# Patient Record
Sex: Female | Born: 1975 | Race: White | Hispanic: No | Marital: Married | Smoking: Never smoker
Health system: Southern US, Community
[De-identification: ages and names within clinical notes are randomized; demographics above are authoritative.]

## PROBLEM LIST (undated history)

## (undated) DIAGNOSIS — Z86018 Personal history of other benign neoplasm: Secondary | ICD-10-CM

## (undated) HISTORY — DX: Personal history of other benign neoplasm: Z86.018

---

## 2000-05-20 HISTORY — PX: ARTHROSCOPIC REPAIR ACL: SUR80

## 2001-05-20 HISTORY — PX: OTHER SURGICAL HISTORY: SHX169

## 2006-07-07 ENCOUNTER — Inpatient Hospital Stay: Payer: Self-pay | Admitting: Obstetrics and Gynecology

## 2006-08-19 LAB — CONVERTED CEMR LAB: Pap Smear: NORMAL

## 2006-10-16 ENCOUNTER — Ambulatory Visit: Payer: Self-pay | Admitting: Family Medicine

## 2006-10-16 DIAGNOSIS — J309 Allergic rhinitis, unspecified: Secondary | ICD-10-CM | POA: Insufficient documentation

## 2006-10-16 DIAGNOSIS — D509 Iron deficiency anemia, unspecified: Secondary | ICD-10-CM | POA: Insufficient documentation

## 2006-10-16 DIAGNOSIS — G43009 Migraine without aura, not intractable, without status migrainosus: Secondary | ICD-10-CM | POA: Insufficient documentation

## 2006-10-22 ENCOUNTER — Ambulatory Visit: Payer: Self-pay | Admitting: Family Medicine

## 2009-01-16 ENCOUNTER — Inpatient Hospital Stay (HOSPITAL_COMMUNITY): Admission: AD | Admit: 2009-01-16 | Discharge: 2009-01-18 | Payer: Self-pay | Admitting: Obstetrics and Gynecology

## 2009-09-09 ENCOUNTER — Ambulatory Visit: Payer: Self-pay | Admitting: Internal Medicine

## 2009-09-09 DIAGNOSIS — H02849 Edema of unspecified eye, unspecified eyelid: Secondary | ICD-10-CM | POA: Insufficient documentation

## 2009-09-11 ENCOUNTER — Telehealth: Payer: Self-pay | Admitting: Family Medicine

## 2010-06-19 NOTE — Progress Notes (Signed)
Summary: call a nurse   Phone Note Call from Patient   Call For: Kerby Nora MD Summary of Call: Triage Record Num: [id number] Operator: Maryella Shivers Patient Name: Connie Rosales Call Date & Time: 09/09/2009 8:03:46AM Patient Phone: [phone] PCP: Kerby Nora Patient Gender: Female PCP Fax : Patient DOB: [birth year] Practice Name: [facility] Reason for Call: [place]/patient calling about redness in right eye.Swollen but not shut, itchy, red. Onset 09/08/09 1500. LMP 1422-has 88 month old baby and breastfeeding.States she is not pregnant now.Advised patient to call back after 9 am and we will call [facility] for appt.All emergent s/s r/o per Eye Infection protocol. Disposition is see provider within 4 hours. Home care advice given.Will call back at 9am. Protocol(s) Used: Eye: Infection / Irritation Recommended Outcome per Protocol: See Provider within 24 hours Reason for Outcome: New onset of eye redness, irritation/foreign body sensation or gritty feeling with yellow/green drainage Care Advice: Clean any eye drainage with clean, wet cloth; use warm tap water. Do not use same cloth on unaffected eye. Do not share washcloth with others.  ~ Call provider if symptoms get worse, or you develop increasing eye pain, changes in vision, or blisters or sores on eye or insides of eyelids.  ~ SEE PROVIDER WITHIN 4 HOURS if eyelid or area surrounding eye is red, warm, or tender.  ~ Avoid touching or rubbing the eyes; wash hands frequently and do not share towels, washcloths or other personal care items with others.  ~  ~ Don't use eye makeup or wear contact lenses until there have been no symptoms for at least 24 hours. 09/09/2009 8:43:35AM Page 1 of 1 [id number] Initial call taken by: Melody Comas,  September 11, 2009 10:49 AM

## 2010-06-19 NOTE — Assessment & Plan Note (Signed)
Summary: NURSING MOTHER/POSS PINK EYE/LD   Vital Signs:  Patient profile:   35 year old female Weight:      119 pounds Temp:     98.3 degrees F oral BP sitting:   110 / 70  (left arm)  Vitals Entered By: Doristine Devoid (September 09, 2009 10:22 AM) CC: R eye itching and redness also w/ crusting    History of Present Illness: Connie Rosales comesin comes in today to [facility]  for acute onset  of right eye swelling discharge and no pain . last pm .  then crusted shut this am .  No trauma or fever or URI signs  No change in visions , No contacts.    wears glasses .  Currently no treatment.  Nursing an 6 month old at home.  35 year old at home .  no signs either  Works at [employer]. No other exposures .    Preventive Screening-Counseling & Management  Caffeine-Diet-Exercise     Does Patient Exercise: yes  Allergies: No Known Drug Allergies  Past History:  Past medical, surgical, family and social histories (including risk factors) reviewed for relevance to current acute and chronic problems.  Past Medical History: Anemia-iron deficiency Allergic rhinitis childbirth x 2   Past Surgical History: Reviewed history from 10/16/2006 and no changes required. 2001 knee arthroscopy  2002 ACL repair, meniscal tear 2003 removal of hardware in knee 06/2006 NSVD   Family History: Reviewed history from 10/16/2006 and no changes required. father: age 17 high chol, obesity mother age 43 osteoporosis dx age 35 MGM: osteoporosis PGM Alzheimer's brother: melanoma no MI before age 39  Social History: Reviewed history from 10/16/2006 and no changes required. currently breast feeding  2 small children at home  Married Never Smoked Alcohol use-yes 1/2 beer once per week Drug use-no Regular exercise-yes, Yoga Diet: fruits and veggies Works [employer]  Review of Systems  The patient denies anorexia, fever, weight loss, weight gain, vision loss, decreased hearing, peripheral edema,  prolonged cough, headaches, transient blindness, abnormal bleeding, enlarged lymph nodes, and angioedema.    Physical Exam  General:  alert, well-developed, well-nourished, and well-hydrated.  with obvious swelling around right eye  Eyes:  right eye  with 1+ lid edema  pink not red and thick yellow crusting  2 + conjuctival injection   left eye moninmal redness  Ears:  R ear normal, L ear normal, and no external deformities.   Nose:  no external deformity, no external erythema, and no nasal discharge.   Mouth:  pharynx pink and moist and no erythema.   Neck:  No deformities, masses, or tenderness noted. Lungs:  normal respiratory effort and no intercostal retractions.   Heart:  normal rate and regular rhythm.   Pulses:  nl cap refill  Skin:  turgor normal, color normal, no ecchymoses, no petechiae, and no purpura.   except fro eyelid  Cervical Nodes:  No lymphadenopathy noted Psych:  Oriented X3, good eye contact, not anxious appearing, and not depressed appearing.     Impression & Recommendations:  Problem # 1:  CONJUNCTIVITIS (ICD-372.30) right   appears bacterial by exam and context. Her updated medication list for this problem includes:    Polymyxin B-trimethoprim 10000-0.1 Unit/ml-% Soln (Polymyxin b-trimethoprim) .Marland Kitchen... 1 drop affected eye after compresses qid  or as directed  Problem # 2:  EDEMA, RIGHT EYELID (ICD-374.82) could be from above   but a bit more prominent than expected for hx     disc  could change to oral meds if persistent or  progressive  but if getting fever pain call for  further evaluation.   hx of allergy but doesnt seem to be   related   Problem # 3:  Nursing mother  disc meds   compatable with nursing if needed.   Complete Medication List: 1)  Prenatal Vitamins Tabs (Prenatal vit-fe fumarate-fa tabs) .... Take one by mouth daily 2)  Polymyxin B-trimethoprim 10000-0.1 Unit/ml-% Soln (Polymyxin b-trimethoprim) .Marland Kitchen.. 1 drop affected eye after compresses qid   or as directed 3)  Augmentin 875-125 Mg Tabs (Amoxicillin-pot clavulanate) .Marland Kitchen.. 1 by mouth two times a day  Patient Instructions: 1)  begin warm compresses  and drops   if increasing swelling can add oral antibioitc . 2)  if fever pain change in vision  seek emergent care. 3)   about  3 days  Prescriptions: AUGMENTIN 875-125 MG TABS (AMOXICILLIN-POT CLAVULANATE) 1 by mouth two times a day  #20 x 0   Entered and Authorized by:   Madelin Headings MD   Signed by:   Madelin Headings MD on 09/09/2009   Method used:   Print then Give to Patient   RxID:   [id number] POLYMYXIN B-TRIMETHOPRIM 10000-0.1 UNIT/ML-% SOLN (POLYMYXIN B-TRIMETHOPRIM) 1 drop affected eye after compresses qid  or as directed  #1 x 0   Entered and Authorized by:   Madelin Headings MD   Signed by:   Madelin Headings MD on 09/09/2009   Method used:   Electronically to        [facility]  [address] (retail)       [address]       [place], [place]  [address]       Ph: [phone] or [phone]       Fax: [phone]   RxID:   [id number]

## 2010-08-25 LAB — CBC
HCT: 30.1 % — ABNORMAL LOW (ref 36.0–46.0)
Hemoglobin: 10.5 g/dL — ABNORMAL LOW (ref 12.0–15.0)
Hemoglobin: 12.6 g/dL (ref 12.0–15.0)
MCHC: 34.3 g/dL (ref 30.0–36.0)
MCHC: 34.7 g/dL (ref 30.0–36.0)
MCV: 98.2 fL (ref 78.0–100.0)
Platelets: 129 10*3/uL — ABNORMAL LOW (ref 150–400)
RBC: 3.07 MIL/uL — ABNORMAL LOW (ref 3.87–5.11)
RBC: 3.76 MIL/uL — ABNORMAL LOW (ref 3.87–5.11)
RDW: 13.2 % (ref 11.5–15.5)
RDW: 13.2 % (ref 11.5–15.5)
WBC: 12.1 10*3/uL — ABNORMAL HIGH (ref 4.0–10.5)

## 2010-08-25 LAB — RPR: RPR Ser Ql: NONREACTIVE

## 2011-05-21 DIAGNOSIS — Z86018 Personal history of other benign neoplasm: Secondary | ICD-10-CM

## 2011-05-21 HISTORY — DX: Personal history of other benign neoplasm: Z86.018

## 2011-10-01 ENCOUNTER — Telehealth: Payer: Self-pay | Admitting: Family Medicine

## 2011-10-01 NOTE — Telephone Encounter (Signed)
Caller: Meridith/Patient; PCP: Kerby Nora E.; CB#: [phone];  Call regarding finding small Tick on arm this morning in shower-10/01/11. She pulled it off quickly and was not able to save it. Not sure if removed whole tick. Area has small red dot. Triage and Care advice per Bites and Stings Protocol and advised to call back with any rash, fever, muscleaches or swollen lymth nodes or with any other concerns.

## 2011-10-01 NOTE — Telephone Encounter (Signed)
Agreed -

## 2012-01-17 ENCOUNTER — Telehealth: Payer: Self-pay | Admitting: Family Medicine

## 2012-01-17 DIAGNOSIS — Z13220 Encounter for screening for lipoid disorders: Secondary | ICD-10-CM

## 2012-01-17 DIAGNOSIS — D509 Iron deficiency anemia, unspecified: Secondary | ICD-10-CM

## 2012-01-17 NOTE — Telephone Encounter (Signed)
Message copied by Excell Seltzer on Fri Jan 17, 2012 12:37 PM ------      Message from: Baldomero Lamy      Created: Tue Jan 14, 2012  3:46 PM      Regarding: Cpx labs Tues 9/10       Please order  future cpx labs for pt's upcomming lab appt.      Thanks      Rodney Booze

## 2012-01-28 ENCOUNTER — Other Ambulatory Visit (INDEPENDENT_AMBULATORY_CARE_PROVIDER_SITE_OTHER): Payer: Self-pay

## 2012-01-28 DIAGNOSIS — D509 Iron deficiency anemia, unspecified: Secondary | ICD-10-CM

## 2012-01-28 DIAGNOSIS — Z13220 Encounter for screening for lipoid disorders: Secondary | ICD-10-CM

## 2012-01-28 LAB — CBC WITH DIFFERENTIAL/PLATELET
Basophils Relative: 0.6 % (ref 0.0–3.0)
Eosinophils Relative: 4.7 % (ref 0.0–5.0)
Hemoglobin: 13.2 g/dL (ref 12.0–15.0)
Lymphocytes Relative: 41.8 % (ref 12.0–46.0)
MCHC: 33.1 g/dL (ref 30.0–36.0)
Monocytes Relative: 5.8 % (ref 3.0–12.0)
Neutro Abs: 2.2 10*3/uL (ref 1.4–7.7)
RBC: 4.18 Mil/uL (ref 3.87–5.11)

## 2012-01-28 LAB — COMPREHENSIVE METABOLIC PANEL
ALT: 23 U/L (ref 0–35)
CO2: 27 mEq/L (ref 19–32)
GFR: 87.2 mL/min (ref >60.00)
Sodium: 138 mEq/L (ref 135–145)
Total Bilirubin: 0.8 mg/dL (ref 0.3–1.2)
Total Protein: 7.5 g/dL (ref 6.0–8.3)

## 2012-01-28 LAB — LIPID PANEL
Total CHOL/HDL Ratio: 2
VLDL: 10.2 mg/dL (ref 0.0–40.0)

## 2012-02-04 ENCOUNTER — Encounter: Payer: Self-pay | Admitting: Family Medicine

## 2012-02-04 ENCOUNTER — Ambulatory Visit (INDEPENDENT_AMBULATORY_CARE_PROVIDER_SITE_OTHER): Payer: BC Managed Care – PPO | Admitting: Family Medicine

## 2012-02-04 VITALS — BP 120/69 | HR 57 | Temp 98.2°F | Ht 63.0 in | Wt 117.0 lb

## 2012-02-04 DIAGNOSIS — G43009 Migraine without aura, not intractable, without status migrainosus: Secondary | ICD-10-CM

## 2012-02-04 DIAGNOSIS — J309 Allergic rhinitis, unspecified: Secondary | ICD-10-CM

## 2012-02-04 DIAGNOSIS — D509 Iron deficiency anemia, unspecified: Secondary | ICD-10-CM

## 2012-02-04 DIAGNOSIS — Z808 Family history of malignant neoplasm of other organs or systems: Secondary | ICD-10-CM | POA: Insufficient documentation

## 2012-02-04 NOTE — Assessment & Plan Note (Signed)
Resolved

## 2012-02-04 NOTE — Assessment & Plan Note (Signed)
No worrisome lesions.. Refer to [facility] for full body skin check.

## 2012-02-04 NOTE — Assessment & Plan Note (Signed)
Stable

## 2012-02-04 NOTE — Progress Notes (Signed)
  Subjective:    Patient ID: Connie Rosales, female    DOB: 12-19-1975, 36 y.o.   MRN: [id number]  HPIShe presents to have chronic health issue maintenance.  Migraine, well controlled . None in ages.  Anemia.Marland KitchenMarland KitchenHg nml now.  Regular exercise (1-2 times a week), vegetarian diet.  She has not been here since 2010.Marland Kitchen Has 36 year old and 36 year old. Has been seeing Dr. Senaida Ores with [facility]. Last exam 04/2011.  Brother with history of skin cancer.. She wants referral for   She noted significant reaction to mosquito bites this year... Redness around 1-2 inches around.  Reviewed labs in detail and answered pt questions.  Review of Systems  Constitutional: Negative for fever and fatigue.  HENT: Negative for ear pain.   Eyes: Negative for pain.  Respiratory: Negative for chest tightness and shortness of breath.   Cardiovascular: Negative for chest pain, palpitations and leg swelling.  Gastrointestinal: Negative for abdominal pain.  Genitourinary: Negative for dysuria.       Objective:   Physical Exam  Constitutional: Vital signs are normal. She appears well-developed and well-nourished. She is cooperative.  Non-toxic appearance. She does not appear ill. No distress.  HENT:  Head: Normocephalic.  Right Ear: Hearing, tympanic membrane, external ear and ear canal normal. Tympanic membrane is not erythematous, not retracted and not bulging.  Left Ear: Hearing, tympanic membrane, external ear and ear canal normal. Tympanic membrane is not erythematous, not retracted and not bulging.  Nose: No mucosal edema or rhinorrhea. Right sinus exhibits no maxillary sinus tenderness and no frontal sinus tenderness. Left sinus exhibits no maxillary sinus tenderness and no frontal sinus tenderness.  Mouth/Throat: Uvula is midline, oropharynx is clear and moist and mucous membranes are normal.  Eyes: Conjunctivae normal, EOM and lids are normal. Pupils are equal, round, and reactive to light. No  foreign bodies found.  Neck: Trachea normal and normal range of motion. Neck supple. Carotid bruit is not present. No mass and no thyromegaly present.  Cardiovascular: Normal rate, regular rhythm, S1 normal, S2 normal, normal heart sounds, intact distal pulses and normal pulses.  Exam reveals no gallop and no friction rub.   No murmur heard. Pulmonary/Chest: Effort normal and breath sounds normal. Not tachypneic. No respiratory distress. She has no decreased breath sounds. She has no wheezes. She has no rhonchi. She has no rales.  Abdominal: Soft. Normal appearance and bowel sounds are normal. There is no tenderness.  Neurological: She is alert.  Skin: Skin is warm, dry and intact. No rash noted.  Psychiatric: Her speech is normal and behavior is normal. Judgment and thought content normal. Her mood appears not anxious. Cognition and memory are normal. She does not exhibit a depressed mood.          Assessment & Plan:  The patient's preventative maintenance and recommended screening tests for an annual wellness exam were reviewed in full today. Brought up to date unless services declined.  Counselled on the importance of diet, exercise, and its role in overall health and mortality. The patient's FH and SH was reviewed, including their home life, tobacco status, and drug and alcohol status.   Vaccines:Uptodate.Marland Kitchenoffered flu.. Will get a work. Nonsmoker PAP/DVE:Nml  Pap 04/2011

## 2012-02-04 NOTE — Patient Instructions (Signed)
Keep up great work with exercise. Get flu vaccine at work. Referral to Dermatology... Stop at front desk to set up.

## 2012-02-04 NOTE — Assessment & Plan Note (Signed)
stable °

## 2012-05-29 ENCOUNTER — Ambulatory Visit (INDEPENDENT_AMBULATORY_CARE_PROVIDER_SITE_OTHER): Payer: BC Managed Care – PPO | Admitting: Family Medicine

## 2012-05-29 ENCOUNTER — Encounter: Payer: Self-pay | Admitting: Family Medicine

## 2012-05-29 VITALS — BP 112/78 | HR 66 | Temp 97.9°F | Ht 63.0 in | Wt 116.5 lb

## 2012-05-29 DIAGNOSIS — R05 Cough: Secondary | ICD-10-CM

## 2012-05-29 DIAGNOSIS — R059 Cough, unspecified: Secondary | ICD-10-CM

## 2012-05-29 DIAGNOSIS — R053 Chronic cough: Secondary | ICD-10-CM

## 2012-05-29 MED ORDER — ALBUTEROL SULFATE HFA 108 (90 BASE) MCG/ACT IN AERS: 2.0000 | INHALATION_SPRAY | Freq: Four times a day (QID) | RESPIRATORY_TRACT | Status: DC | PRN

## 2012-05-29 NOTE — Assessment & Plan Note (Signed)
Likely postinflammatory bronchospasm. Albuterol prn. If not getting better.. Consider atypical PNA vs bacterial infection...consider antibiotics if not improving in 7-10 more days.

## 2012-05-29 NOTE — Progress Notes (Signed)
  Subjective:    Patient ID: Connie Rosales, female    DOB: [birth year], 37 y.o.   MRN: [id number]  Cough This is a new problem. The current episode started 1 to 4 weeks ago (started 12/28). The problem has been gradually worsening. The cough is productive of sputum. Associated symptoms include nasal congestion, a sore throat and weight loss. Pertinent negatives include no ear congestion, ear pain, fever, headaches, heartburn, myalgias, rash, shortness of breath or wheezing. She has tried OTC cough suppressant for the symptoms. The treatment provided moderate relief. Her past medical history is significant for environmental allergies. There is no history of asthma, bronchiectasis, bronchitis, COPD, emphysema or pneumonia.  Sore Throat  Associated symptoms include coughing. Pertinent negatives include no ear pain, headaches or shortness of breath.      Review of Systems  Constitutional: Positive for weight loss. Negative for fever.  HENT: Positive for sore throat. Negative for ear pain.   Respiratory: Positive for cough. Negative for shortness of breath and wheezing.   Gastrointestinal: Negative for heartburn.  Musculoskeletal: Negative for myalgias.  Skin: Negative for rash.  Neurological: Negative for headaches.  Hematological: Positive for environmental allergies.       Objective:   Physical Exam  Constitutional: Vital signs are normal. She appears well-developed and well-nourished. She is cooperative.  Non-toxic appearance. She does not appear ill. No distress.  HENT:  Head: Normocephalic.  Right Ear: Hearing, tympanic membrane, external ear and ear canal normal. Tympanic membrane is not erythematous, not retracted and not bulging.  Left Ear: Hearing, tympanic membrane, external ear and ear canal normal. Tympanic membrane is not erythematous, not retracted and not bulging.  Nose: Mucosal edema and rhinorrhea present. Right sinus exhibits no maxillary sinus tenderness and no frontal  sinus tenderness. Left sinus exhibits no maxillary sinus tenderness and no frontal sinus tenderness.  Mouth/Throat: Uvula is midline, oropharynx is clear and moist and mucous membranes are normal.  Eyes: Conjunctivae normal, EOM and lids are normal. Pupils are equal, round, and reactive to light. No foreign bodies found.  Neck: Trachea normal and normal range of motion. Neck supple. Carotid bruit is not present. No mass and no thyromegaly present.  Cardiovascular: Normal rate, regular rhythm, S1 normal, S2 normal, normal heart sounds, intact distal pulses and normal pulses.  Exam reveals no gallop and no friction rub.   No murmur heard. Pulmonary/Chest: Effort normal and breath sounds normal. Not tachypneic. No respiratory distress. She has no decreased breath sounds. She has no wheezes. She has no rhonchi. She has no rales.  Neurological: She is alert.  Skin: Skin is warm, dry and intact. No rash noted.  Psychiatric: Her speech is normal and behavior is normal. Judgment normal. Her mood appears not anxious. Cognition and memory are normal. She does not exhibit a depressed mood.          Assessment & Plan:

## 2012-05-29 NOTE — Patient Instructions (Addendum)
Likely postinflammatory bronchospasm. Use albuterol inhaler as needed for coughing fits. If not improving in 1-2 weeks, call for possible antibiotics. Ask for [facility].

## 2015-01-10 ENCOUNTER — Other Ambulatory Visit (INDEPENDENT_AMBULATORY_CARE_PROVIDER_SITE_OTHER): Payer: BLUE CROSS/BLUE SHIELD

## 2015-01-10 ENCOUNTER — Telehealth: Payer: Self-pay | Admitting: Family Medicine

## 2015-01-10 DIAGNOSIS — D509 Iron deficiency anemia, unspecified: Secondary | ICD-10-CM

## 2015-01-10 DIAGNOSIS — Z13220 Encounter for screening for lipoid disorders: Secondary | ICD-10-CM

## 2015-01-10 LAB — LIPID PANEL
CHOLESTEROL: 187 mg/dL (ref 0–200)
HDL: 78 mg/dL (ref >39.00)
LDL Cholesterol: 95 mg/dL (ref 0–99)
NONHDL: 109.03
Total CHOL/HDL Ratio: 2
Triglycerides: 70 mg/dL (ref 0.0–149.0)
VLDL: 14 mg/dL (ref 0.0–40.0)

## 2015-01-10 LAB — CBC WITH DIFFERENTIAL/PLATELET
BASOS ABS: 0 10*3/uL (ref 0.0–0.1)
Basophils Relative: 0.4 % (ref 0.0–3.0)
EOS ABS: 0.1 10*3/uL (ref 0.0–0.7)
EOS PCT: 2.8 % (ref 0.0–5.0)
HCT: 37.1 % (ref 36.0–46.0)
HEMOGLOBIN: 12.5 g/dL (ref 12.0–15.0)
LYMPHS ABS: 2.1 10*3/uL (ref 0.7–4.0)
Lymphocytes Relative: 45.8 % (ref 12.0–46.0)
MCHC: 33.7 g/dL (ref 30.0–36.0)
MCV: 94.7 fl (ref 78.0–100.0)
MONO ABS: 0.3 10*3/uL (ref 0.1–1.0)
Monocytes Relative: 6.8 % (ref 3.0–12.0)
NEUTROS PCT: 44.2 % (ref 43.0–77.0)
Neutro Abs: 2 10*3/uL (ref 1.4–7.7)
Platelets: 216 10*3/uL (ref 150.0–400.0)
RBC: 3.92 Mil/uL (ref 3.87–5.11)
RDW: 12.7 % (ref 11.5–15.5)
WBC: 4.5 10*3/uL (ref 4.0–10.5)

## 2015-01-10 LAB — COMPREHENSIVE METABOLIC PANEL
ALBUMIN: 4.2 g/dL (ref 3.5–5.2)
ALK PHOS: 53 U/L (ref 39–117)
ALT: 12 U/L (ref 0–35)
AST: 18 U/L (ref 0–37)
BILIRUBIN TOTAL: 0.4 mg/dL (ref 0.2–1.2)
BUN: 12 mg/dL (ref 6–23)
CO2: 28 mEq/L (ref 19–32)
CREATININE: 0.81 mg/dL (ref 0.40–1.20)
Calcium: 9.2 mg/dL (ref 8.4–10.5)
Chloride: 105 mEq/L (ref 96–112)
GFR: 83.4 mL/min (ref >60.00)
GLUCOSE: 89 mg/dL (ref 70–99)
Potassium: 4.4 mEq/L (ref 3.5–5.1)
SODIUM: 139 meq/L (ref 135–145)
TOTAL PROTEIN: 7.2 g/dL (ref 6.0–8.3)

## 2015-01-10 NOTE — Telephone Encounter (Signed)
-----   Message from Marchia Bond sent at 01/02/2015  3:33 PM EDT ----- Regarding: cpx labs 8/23, need orders please :-) Please order  future cpx labs for pt's upcoming lab appt. Thanks Aniceto Boss

## 2015-01-13 ENCOUNTER — Encounter: Payer: Self-pay | Admitting: Family Medicine

## 2015-01-13 ENCOUNTER — Ambulatory Visit (INDEPENDENT_AMBULATORY_CARE_PROVIDER_SITE_OTHER): Payer: BLUE CROSS/BLUE SHIELD | Admitting: Family Medicine

## 2015-01-13 VITALS — BP 106/70 | HR 62 | Temp 98.3°F | Ht 62.25 in | Wt 118.5 lb

## 2015-01-13 DIAGNOSIS — Z0000 Encounter for general adult medical examination without abnormal findings: Secondary | ICD-10-CM | POA: Diagnosis not present

## 2015-01-13 NOTE — Patient Instructions (Signed)
Keep working on healthy eating and regular exercise.  Have a great year!  Call when you get your flu shot.

## 2015-01-13 NOTE — Progress Notes (Signed)
Subjective:    Patient ID: Connie Rosales, female    DOB: [birth year], 39 y.o.   MRN: [id number]  HPI  39 year old female presents for wellness visit.  Doing well overall.  BP Readings from Last 3 Encounters:  01/13/15 106/70  05/29/12 112/78  02/04/12 120/69    Wt Readings from Last 3 Encounters:  01/13/15 118 lb 8 oz (53.751 kg)  05/29/12 116 lb 8 oz (52.844 kg)  02/04/12 117 lb (53.071 kg)   Diet: vegetarian, trying  To keep up protein, takes iron pills and multivitamin.  Exercise:  Reviewed labs in detail.   family history includes Cancer in her father; Glaucoma in her mother; Heart disease in her maternal grandfather and paternal grandfather; Hyperlipidemia in her father; Macular degeneration in her mother; Melanoma (age of onset: 31) in her brother; Osteoporosis in her maternal grandmother; Osteoporosis (age of onset: 11) in her mother.    reports that she has never smoked. She has never used smokeless tobacco. She reports that she drinks alcohol. She reports that she does not use illicit drugs. Review of Systems  Constitutional: Negative for fever and fatigue.  HENT: Negative for congestion.   Eyes: Negative for pain.  Respiratory: Negative for cough and shortness of breath.   Cardiovascular: Negative for chest pain, palpitations and leg swelling.  Gastrointestinal: Negative for abdominal pain.  Genitourinary: Negative for dysuria and vaginal bleeding.  Musculoskeletal: Positive for back pain.  Neurological: Negative for syncope, light-headedness and headaches.  Psychiatric/Behavioral: Negative for dysphoric mood.       Objective:   Physical Exam  Constitutional: Vital signs are normal. She appears well-developed and well-nourished. She is cooperative.  Non-toxic appearance. She does not appear ill. No distress.  HENT:  Head: Normocephalic.  Right Ear: Hearing, tympanic membrane, external ear and ear canal normal.  Left Ear: Hearing, tympanic membrane,  external ear and ear canal normal.  Nose: Nose normal.  Eyes: Conjunctivae, EOM and lids are normal. Pupils are equal, round, and reactive to light. Lids are everted and swept, no foreign bodies found.  Neck: Trachea normal and normal range of motion. Neck supple. Carotid bruit is not present. No thyroid mass and no thyromegaly present.  Cardiovascular: Normal rate, regular rhythm, S1 normal, S2 normal, normal heart sounds and intact distal pulses.  Exam reveals no gallop.   No murmur heard. Pulmonary/Chest: Effort normal and breath sounds normal. No respiratory distress. She has no wheezes. She has no rhonchi. She has no rales.  Abdominal: Soft. Normal appearance and bowel sounds are normal. She exhibits no distension, no fluid wave, no abdominal bruit and no mass. There is no hepatosplenomegaly. There is no tenderness. There is no rebound, no guarding and no CVA tenderness. No hernia.  Lymphadenopathy:    She has no cervical adenopathy.    She has no axillary adenopathy.  Neurological: She is alert. She has normal strength. No cranial nerve deficit or sensory deficit.  Skin: Skin is warm, dry and intact. No rash noted.  Psychiatric: Her speech is normal and behavior is normal. Judgment normal. Her mood appears not anxious. Cognition and memory are normal. She does not exhibit a depressed mood.          Assessment & Plan:  The patient's preventative maintenance and recommended screening tests for an annual wellness exam were reviewed in full today. Brought up to date unless services declined.  Counselled on the importance of diet, exercise, and its role in overall health and mortality.  The patient's FH and SH was reviewed, including their home life, tobacco status, and drug and alcohol status.   Vaccines: uptodate with Td, due for flu at employee [facility] PAP/DVE: Sees Dr. Marvel Plan, last app in fall. No early family history of breast cancer or colon cancer. Nonsmoker.

## 2015-01-13 NOTE — Progress Notes (Signed)
Pre visit review using our clinic review tool, if applicable. No additional management support is needed unless otherwise documented below in the visit note. 

## 2015-02-07 ENCOUNTER — Encounter: Payer: Self-pay | Admitting: Family Medicine

## 2015-02-07 ENCOUNTER — Ambulatory Visit (INDEPENDENT_AMBULATORY_CARE_PROVIDER_SITE_OTHER): Payer: BLUE CROSS/BLUE SHIELD | Admitting: Family Medicine

## 2015-02-07 VITALS — BP 108/64 | HR 72 | Temp 98.5°F | Ht 62.25 in | Wt 121.5 lb

## 2015-02-07 DIAGNOSIS — B309 Viral conjunctivitis, unspecified: Secondary | ICD-10-CM

## 2015-02-07 NOTE — Assessment & Plan Note (Signed)
Symptomatic care. Call if noit improving as expected for consideration of antibiotic drops.

## 2015-02-07 NOTE — Progress Notes (Signed)
Pre visit review using our clinic review tool, if applicable. No additional management support is needed unless otherwise documented below in the visit note. 

## 2015-02-07 NOTE — Progress Notes (Signed)
   Subjective:    Patient ID: Connie Rosales, female    DOB: Jan 23, 1976, 39 y.o.   MRN: [id number]  Conjunctivitis  The current episode started today. The onset was sudden. The problem occurs continuously. The problem has been gradually improving. The problem is mild. Relieved by: tried allergy drops. Nothing aggravates the symptoms. Associated symptoms include eye itching, congestion, swollen glands, eye discharge and eye redness. Pertinent negatives include no fever, no decreased vision, no double vision, no photophobia, no ear discharge, no ear pain, no headaches, no rhinorrhea, no sore throat and no eye pain.     No history of fall allergies.    Review of Systems  Constitutional: Negative for fever.  HENT: Positive for congestion. Negative for ear discharge, ear pain, rhinorrhea and sore throat.   Eyes: Positive for discharge, redness and itching. Negative for double vision, photophobia and pain.  Neurological: Negative for headaches.       Objective:   Physical Exam  Constitutional: Vital signs are normal. She appears well-developed and well-nourished. She is cooperative.  Non-toxic appearance. She does not appear ill. No distress.  HENT:  Head: Normocephalic.  Right Ear: Hearing, tympanic membrane, external ear and ear canal normal. Tympanic membrane is not erythematous, not retracted and not bulging.  Left Ear: Hearing, tympanic membrane, external ear and ear canal normal. Tympanic membrane is not erythematous, not retracted and not bulging.  Nose: Mucosal edema and rhinorrhea present. Right sinus exhibits no maxillary sinus tenderness and no frontal sinus tenderness. Left sinus exhibits no maxillary sinus tenderness and no frontal sinus tenderness.  Mouth/Throat: Uvula is midline, oropharynx is clear and moist and mucous membranes are normal.  Eyes: EOM and lids are normal. Pupils are equal, round, and reactive to light. Lids are everted and swept, no foreign bodies found.  Right eye exhibits no discharge. Left eye exhibits no discharge. Right conjunctiva is injected. Left conjunctiva is not injected.  Neck: Trachea normal and normal range of motion. Neck supple. Carotid bruit is not present. No thyroid mass and no thyromegaly present.  Cardiovascular: Normal rate, regular rhythm, S1 normal, S2 normal, normal heart sounds, intact distal pulses and normal pulses.  Exam reveals no gallop and no friction rub.   No murmur heard. Pulmonary/Chest: Effort normal and breath sounds normal. No tachypnea. No respiratory distress. She has no decreased breath sounds. She has no wheezes. She has no rhonchi. She has no rales.  Neurological: She is alert.  Skin: Skin is warm, dry and intact. No rash noted.  Psychiatric: Her speech is normal and behavior is normal. Judgment normal. Her mood appears not anxious. Cognition and memory are normal. She does not exhibit a depressed mood.          Assessment & Plan:

## 2015-02-07 NOTE — Patient Instructions (Signed)
Symptomatic care.  Apply  salt water/saline drops 3-4 times a day.  Keep hands clean, do not touch eyes.  Call if not improving or getting worse in next 4-5 days.

## 2016-01-03 DIAGNOSIS — Z1283 Encounter for screening for malignant neoplasm of skin: Secondary | ICD-10-CM | POA: Diagnosis not present

## 2016-01-03 DIAGNOSIS — D229 Melanocytic nevi, unspecified: Secondary | ICD-10-CM | POA: Diagnosis not present

## 2016-01-03 DIAGNOSIS — D1800 Hemangioma unspecified site: Secondary | ICD-10-CM | POA: Diagnosis not present

## 2016-01-03 DIAGNOSIS — D225 Melanocytic nevi of trunk: Secondary | ICD-10-CM | POA: Diagnosis not present

## 2016-01-03 DIAGNOSIS — L821 Other seborrheic keratosis: Secondary | ICD-10-CM | POA: Diagnosis not present

## 2016-01-03 DIAGNOSIS — D485 Neoplasm of uncertain behavior of skin: Secondary | ICD-10-CM | POA: Diagnosis not present

## 2016-02-06 ENCOUNTER — Other Ambulatory Visit (INDEPENDENT_AMBULATORY_CARE_PROVIDER_SITE_OTHER): Payer: BLUE CROSS/BLUE SHIELD

## 2016-02-06 ENCOUNTER — Telehealth: Payer: Self-pay | Admitting: Family Medicine

## 2016-02-06 DIAGNOSIS — Z13220 Encounter for screening for lipoid disorders: Secondary | ICD-10-CM

## 2016-02-06 DIAGNOSIS — D509 Iron deficiency anemia, unspecified: Secondary | ICD-10-CM | POA: Diagnosis not present

## 2016-02-06 LAB — COMPREHENSIVE METABOLIC PANEL
ALBUMIN: 4.4 g/dL (ref 3.5–5.2)
ALK PHOS: 55 U/L (ref 39–117)
ALT: 14 U/L (ref 0–35)
AST: 18 U/L (ref 0–37)
BILIRUBIN TOTAL: 0.6 mg/dL (ref 0.2–1.2)
BUN: 13 mg/dL (ref 6–23)
CALCIUM: 9 mg/dL (ref 8.4–10.5)
CHLORIDE: 105 meq/L (ref 96–112)
CO2: 30 mEq/L (ref 19–32)
CREATININE: 0.88 mg/dL (ref 0.40–1.20)
GFR: 75.38 mL/min (ref >60.00)
Glucose, Bld: 87 mg/dL (ref 70–99)
Potassium: 4.3 mEq/L (ref 3.5–5.1)
Sodium: 139 mEq/L (ref 135–145)
TOTAL PROTEIN: 7.5 g/dL (ref 6.0–8.3)

## 2016-02-06 LAB — CBC WITH DIFFERENTIAL/PLATELET
BASOS PCT: 0.2 % (ref 0.0–3.0)
Basophils Absolute: 0 10*3/uL (ref 0.0–0.1)
EOS ABS: 0.1 10*3/uL (ref 0.0–0.7)
Eosinophils Relative: 2 % (ref 0.0–5.0)
HEMATOCRIT: 37.5 % (ref 36.0–46.0)
HEMOGLOBIN: 12.8 g/dL (ref 12.0–15.0)
LYMPHS PCT: 31.8 % (ref 12.0–46.0)
Lymphs Abs: 1.8 10*3/uL (ref 0.7–4.0)
MCHC: 34 g/dL (ref 30.0–36.0)
MCV: 94.2 fl (ref 78.0–100.0)
Monocytes Absolute: 0.3 10*3/uL (ref 0.1–1.0)
Monocytes Relative: 5 % (ref 3.0–12.0)
Neutro Abs: 3.4 10*3/uL (ref 1.4–7.7)
Neutrophils Relative %: 61 % (ref 43.0–77.0)
Platelets: 235 10*3/uL (ref 150.0–400.0)
RBC: 3.98 Mil/uL (ref 3.87–5.11)
RDW: 12.9 % (ref 11.5–15.5)
WBC: 5.5 10*3/uL (ref 4.0–10.5)

## 2016-02-06 LAB — LIPID PANEL
CHOL/HDL RATIO: 2
CHOLESTEROL: 189 mg/dL (ref 0–200)
HDL: 84.9 mg/dL (ref >39.00)
LDL Cholesterol: 90 mg/dL (ref 0–99)
NONHDL: 104.14
TRIGLYCERIDES: 70 mg/dL (ref 0.0–149.0)
VLDL: 14 mg/dL (ref 0.0–40.0)

## 2016-02-06 NOTE — Telephone Encounter (Signed)
-----   Message from Marchia Bond sent at 02/02/2016  2:52 PM EDT ----- Regarding: Cpx labs Tues 9/19, need orders. Thanks! :-) Please order  future cpx labs for pt's upcoming lab appt. Thanks Aniceto Boss

## 2016-02-09 ENCOUNTER — Ambulatory Visit (INDEPENDENT_AMBULATORY_CARE_PROVIDER_SITE_OTHER): Payer: BLUE CROSS/BLUE SHIELD | Admitting: Family Medicine

## 2016-02-09 ENCOUNTER — Encounter: Payer: Self-pay | Admitting: Family Medicine

## 2016-02-09 VITALS — BP 116/60 | HR 63 | Temp 98.2°F | Ht 62.25 in | Wt 119.0 lb

## 2016-02-09 DIAGNOSIS — Z0000 Encounter for general adult medical examination without abnormal findings: Secondary | ICD-10-CM

## 2016-02-09 NOTE — Progress Notes (Signed)
40 year old female presents for wellness visit.  Doing well overall. Followed GYN: Dr. Marvel Plan. GYN visit soon.  BP Readings from Last 3 Encounters:  02/09/16 116/60  02/07/15 108/64  01/13/15 106/70   Wt Readings from Last 3 Encounters:  02/09/16 119 lb (54 kg)  02/07/15 121 lb 8 oz (55.1 kg)  01/13/15 118 lb 8 oz (53.8 kg)    Body mass index is 21.59 kg/m.  Diet: vegetarian, trying to keep up protein, takes iron pills and multivitamin.  Exercise: Yoga 2 times a week, run 2 times a week.  Reviewed labs in detail.   Followed by derm for brother with melanoma   Family history includes Cancer in her father; Glaucoma in her mother; Heart disease in her maternal grandfather and paternal grandfather; Hyperlipidemia in her father; Macular degeneration in her mother; Melanoma (age of onset: 87) in her brother; Osteoporosis in her maternal grandmother; Osteoporosis (age of onset: 31) in her mother. Brother with melanoma.   Reports that she has never smoked. She has never used smokeless tobacco. She reports that she drinks alcohol, rarely. She reports that she does not use illicit drugs.  Review of Systems  Constitutional: Negative for fever and fatigue.  HENT: Negative for congestion.   Eyes: Negative for pain.  Respiratory: Negative for cough and shortness of breath.   Cardiovascular: Negative for chest pain, palpitations and leg swelling.  Gastrointestinal: Negative for abdominal pain.  Genitourinary: Negative for dysuria and vaginal bleeding.  Musculoskeletal: Positive for back pain.  Neurological: Negative for syncope, light-headedness and headaches.  Psychiatric/Behavioral: Negative for dysphoric mood.       Objective:   Physical Exam  Constitutional: Vital signs are normal. She appears well-developed and well-nourished. She is cooperative.  Non-toxic appearance. She does not appear ill. No distress.  HENT:  Head: Normocephalic.  Right Ear: Hearing,  tympanic membrane, external ear and ear canal normal.  Left Ear: Hearing, tympanic membrane, external ear and ear canal normal.  Nose: Nose normal.  Eyes: Conjunctivae, EOM and lids are normal. Pupils are equal, round, and reactive to light. Lids are everted and swept, no foreign bodies found.  Neck: Trachea normal and normal range of motion. Neck supple. Carotid bruit is not present. No thyroid mass and no thyromegaly present.  Cardiovascular: Normal rate, regular rhythm, S1 normal, S2 normal, normal heart sounds and intact distal pulses.  Exam reveals no gallop.   No murmur heard. Pulmonary/Chest: Effort normal and breath sounds normal. No respiratory distress. She has no wheezes. She has no rhonchi. She has no rales.  Abdominal: Soft. Normal appearance and bowel sounds are normal. She exhibits no distension, no fluid wave, no abdominal bruit and no mass. There is no hepatosplenomegaly. There is no tenderness. There is no rebound, no guarding and no CVA tenderness. No hernia.  Lymphadenopathy:    She has no cervical adenopathy.    She has no axillary adenopathy.  Neurological: She is alert. She has normal strength. No cranial nerve deficit or sensory deficit.  Skin: Skin is warm, dry and intact. No rash noted.  Psychiatric: Her speech is normal and behavior is normal. Judgment normal. Her mood appears not anxious. Cognition and memory are normal. She does not exhibit a depressed mood.          Assessment & Plan:  The patient's preventative maintenance and recommended screening tests for an annual wellness exam were reviewed in full today. Brought up to date unless services declined.  Counselled on the importance  of diet, exercise, and its role in overall health and mortality. The patient's FH and SH was reviewed, including their home life, tobacco status, and drug and alcohol status.   Vaccines: uptodate with Td, due for flu at employee [facility] PAP/DVE: Sees Dr. Marvel Plan,  pap in  fall. No early family history of breast cancer or colon cancer. Nonsmoker. HIV: refused

## 2016-02-09 NOTE — Progress Notes (Signed)
Pre visit review using our clinic review tool, if applicable. No additional management support is needed unless otherwise documented below in the visit note. 

## 2016-02-09 NOTE — Patient Instructions (Signed)
Keep up great work on healthy lifestyle! 

## 2016-02-14 DIAGNOSIS — H43312 Vitreous membranes and strands, left eye: Secondary | ICD-10-CM | POA: Diagnosis not present

## 2016-02-29 DIAGNOSIS — Z01419 Encounter for gynecological examination (general) (routine) without abnormal findings: Secondary | ICD-10-CM | POA: Diagnosis not present

## 2016-02-29 DIAGNOSIS — Z1389 Encounter for screening for other disorder: Secondary | ICD-10-CM | POA: Diagnosis not present

## 2016-02-29 DIAGNOSIS — Z1231 Encounter for screening mammogram for malignant neoplasm of breast: Secondary | ICD-10-CM | POA: Diagnosis not present

## 2016-02-29 DIAGNOSIS — Z124 Encounter for screening for malignant neoplasm of cervix: Secondary | ICD-10-CM | POA: Diagnosis not present

## 2016-02-29 DIAGNOSIS — Z6822 Body mass index (BMI) 22.0-22.9, adult: Secondary | ICD-10-CM | POA: Diagnosis not present

## 2016-02-29 DIAGNOSIS — Z3041 Encounter for surveillance of contraceptive pills: Secondary | ICD-10-CM | POA: Diagnosis not present

## 2016-02-29 DIAGNOSIS — Z1151 Encounter for screening for human papillomavirus (HPV): Secondary | ICD-10-CM | POA: Diagnosis not present

## 2017-02-14 ENCOUNTER — Telehealth: Payer: Self-pay | Admitting: Family Medicine

## 2017-02-14 ENCOUNTER — Other Ambulatory Visit (INDEPENDENT_AMBULATORY_CARE_PROVIDER_SITE_OTHER): Payer: BLUE CROSS/BLUE SHIELD

## 2017-02-14 DIAGNOSIS — D509 Iron deficiency anemia, unspecified: Secondary | ICD-10-CM | POA: Diagnosis not present

## 2017-02-14 DIAGNOSIS — Z13220 Encounter for screening for lipoid disorders: Secondary | ICD-10-CM

## 2017-02-14 LAB — COMPREHENSIVE METABOLIC PANEL
ALBUMIN: 4.3 g/dL (ref 3.5–5.2)
ALK PHOS: 56 U/L (ref 39–117)
ALT: 14 U/L (ref 0–35)
AST: 17 U/L (ref 0–37)
BILIRUBIN TOTAL: 0.5 mg/dL (ref 0.2–1.2)
BUN: 11 mg/dL (ref 6–23)
CO2: 29 mEq/L (ref 19–32)
CREATININE: 0.81 mg/dL (ref 0.40–1.20)
Calcium: 9.2 mg/dL (ref 8.4–10.5)
Chloride: 102 mEq/L (ref 96–112)
GFR: 82.53 mL/min (ref >60.00)
GLUCOSE: 86 mg/dL (ref 70–99)
POTASSIUM: 4.5 meq/L (ref 3.5–5.1)
Sodium: 135 mEq/L (ref 135–145)
TOTAL PROTEIN: 7.1 g/dL (ref 6.0–8.3)

## 2017-02-14 LAB — CBC WITH DIFFERENTIAL/PLATELET
BASOS ABS: 0 10*3/uL (ref 0.0–0.1)
Basophils Relative: 0.5 % (ref 0.0–3.0)
EOS ABS: 0.2 10*3/uL (ref 0.0–0.7)
Eosinophils Relative: 4.7 % (ref 0.0–5.0)
HCT: 36.3 % (ref 36.0–46.0)
HEMOGLOBIN: 12 g/dL (ref 12.0–15.0)
LYMPHS ABS: 1.6 10*3/uL (ref 0.7–4.0)
Lymphocytes Relative: 35.9 % (ref 12.0–46.0)
MCHC: 33 g/dL (ref 30.0–36.0)
MCV: 97.4 fl (ref 78.0–100.0)
MONO ABS: 0.3 10*3/uL (ref 0.1–1.0)
Monocytes Relative: 5.8 % (ref 3.0–12.0)
Neutro Abs: 2.4 10*3/uL (ref 1.4–7.7)
Neutrophils Relative %: 53.1 % (ref 43.0–77.0)
Platelets: 224 10*3/uL (ref 150.0–400.0)
RBC: 3.73 Mil/uL — AB (ref 3.87–5.11)
RDW: 12.3 % (ref 11.5–15.5)
WBC: 4.6 10*3/uL (ref 4.0–10.5)

## 2017-02-14 LAB — LIPID PANEL
Cholesterol: 194 mg/dL (ref 0–200)
HDL: 76.6 mg/dL (ref >39.00)
LDL Cholesterol: 98 mg/dL (ref 0–99)
NONHDL: 116.99
Total CHOL/HDL Ratio: 3
Triglycerides: 96 mg/dL (ref 0.0–149.0)
VLDL: 19.2 mg/dL (ref 0.0–40.0)

## 2017-02-14 NOTE — Telephone Encounter (Signed)
-----   Message from Ellamae Sia sent at 02/14/2017  8:59 AM EDT ----- Regarding: Lab orders for now thanks Patient is scheduled for CPX labs, please order future labs, Thanks , Karna Christmas

## 2017-02-18 ENCOUNTER — Encounter: Payer: Self-pay | Admitting: Family Medicine

## 2017-02-18 ENCOUNTER — Ambulatory Visit (INDEPENDENT_AMBULATORY_CARE_PROVIDER_SITE_OTHER): Payer: BLUE CROSS/BLUE SHIELD | Admitting: Family Medicine

## 2017-02-18 VITALS — BP 108/70 | HR 63 | Temp 98.2°F | Ht 62.0 in | Wt 119.0 lb

## 2017-02-18 DIAGNOSIS — Z0000 Encounter for general adult medical examination without abnormal findings: Secondary | ICD-10-CM | POA: Diagnosis not present

## 2017-02-18 DIAGNOSIS — Z808 Family history of malignant neoplasm of other organs or systems: Secondary | ICD-10-CM

## 2017-02-18 DIAGNOSIS — Z23 Encounter for immunization: Secondary | ICD-10-CM

## 2017-02-18 NOTE — Patient Instructions (Addendum)
Can use glucosamine 1-3 times daily as needed for knee pain.  keep up great job on healthy eating and regular exercise.

## 2017-02-18 NOTE — Progress Notes (Signed)
Subjective:    Patient ID: Connie Rosales, female    DOB: 01-05-1976, 41 y.o.   MRN: [id number]  HPI The patient is here for annual wellness exam and preventative care.    Doing well overall . Followed by Dr. Marvel Plan, GYN. Reviewed labs in detail.  Wt Readings from Last 3 Encounters:  02/18/17 119 lb (54 kg)  02/09/16 119 lb (54 kg)  02/07/15 121 lb 8 oz (55.1 kg)  Body mass index is 21.77 kg/m.  Diet : vegetarian, beans for protein. Exercise: Once week run and [employer]. Yoga off and on.    Social History /Family History/Past Medical History reviewed in detail and updated in EMR if needed. Blood pressure 108/70, pulse 63, temperature 98.2 F (36.8 C), temperature source Oral, height 5\' 2"  (1.575 m), weight 119 lb (54 kg), last menstrual period 02/15/2017, SpO2 98 %.   Review of Systems  Constitutional: Negative for fatigue and fever.  HENT: Negative for congestion.   Eyes: Negative for pain.  Respiratory: Negative for cough and shortness of breath.   Cardiovascular: Negative for chest pain, palpitations and leg swelling.  Gastrointestinal: Negative for abdominal pain.  Genitourinary: Negative for dysuria and vaginal bleeding.  Musculoskeletal: Negative for back pain.  Neurological: Negative for syncope, light-headedness and headaches.  Psychiatric/Behavioral: Negative for dysphoric mood.       Objective:   Physical Exam  Constitutional: She is oriented to person, place, and time. Vital signs are normal. She appears well-developed and well-nourished. She is cooperative.  Non-toxic appearance. She does not appear ill. No distress.  HENT:  Head: Normocephalic.  Right Ear: Hearing, tympanic membrane, external ear and ear canal normal. Tympanic membrane is not erythematous, not retracted and not bulging.  Left Ear: Hearing, tympanic membrane, external ear and ear canal normal. Tympanic membrane is not erythematous, not retracted and not bulging.  Nose: No  mucosal edema or rhinorrhea. Right sinus exhibits no maxillary sinus tenderness and no frontal sinus tenderness. Left sinus exhibits no maxillary sinus tenderness and no frontal sinus tenderness.  Mouth/Throat: Uvula is midline, oropharynx is clear and moist and mucous membranes are normal.  Eyes: Pupils are equal, round, and reactive to light. Conjunctivae, EOM and lids are normal. Lids are everted and swept, no foreign bodies found.  Neck: Trachea normal and normal range of motion. Neck supple. Carotid bruit is not present. No thyroid mass and no thyromegaly present.  Cardiovascular: Normal rate, regular rhythm, S1 normal, S2 normal, normal heart sounds, intact distal pulses and normal pulses.  Exam reveals no gallop and no friction rub.   No murmur heard. Pulmonary/Chest: Effort normal and breath sounds normal. No tachypnea. No respiratory distress. She has no decreased breath sounds. She has no wheezes. She has no rhonchi. She has no rales.  Abdominal: Soft. Normal appearance and bowel sounds are normal. There is no tenderness.  Neurological: She is alert and oriented to person, place, and time.  Skin: Skin is warm, dry and intact. No rash noted.  Psychiatric: Her speech is normal and behavior is normal. Judgment and thought content normal. Her mood appears not anxious. Cognition and memory are normal. She does not exhibit a depressed mood.          Assessment & Plan:  The patient's preventative maintenance and recommended screening tests for an annual wellness exam were reviewed in full today. Brought up to date unless services declined.  Counselled on the importance of diet, exercise, and its role in overall health and  mortality. The patient's FH and SH was reviewed, including their home life, tobacco status, and drug and alcohol status.   Vaccines: Due for Tdap, due for flu at employee [facility] PAP/DVE: Sees Dr. Marvel Plan,  pap in fall.  Mammogram every year planned. No early family  history of breast cancer or colon cancer. Nonsmoker. HIV: refused

## 2017-02-18 NOTE — Assessment & Plan Note (Signed)
Followed by Payton Mccallum. N o skin issues today.

## 2017-03-10 DIAGNOSIS — Z1283 Encounter for screening for malignant neoplasm of skin: Secondary | ICD-10-CM | POA: Diagnosis not present

## 2017-03-10 DIAGNOSIS — D485 Neoplasm of uncertain behavior of skin: Secondary | ICD-10-CM | POA: Diagnosis not present

## 2017-03-10 DIAGNOSIS — D229 Melanocytic nevi, unspecified: Secondary | ICD-10-CM | POA: Diagnosis not present

## 2017-03-10 DIAGNOSIS — L814 Other melanin hyperpigmentation: Secondary | ICD-10-CM | POA: Diagnosis not present

## 2017-04-22 DIAGNOSIS — Z6822 Body mass index (BMI) 22.0-22.9, adult: Secondary | ICD-10-CM | POA: Diagnosis not present

## 2017-04-22 DIAGNOSIS — Z1389 Encounter for screening for other disorder: Secondary | ICD-10-CM | POA: Diagnosis not present

## 2017-04-22 DIAGNOSIS — Z01419 Encounter for gynecological examination (general) (routine) without abnormal findings: Secondary | ICD-10-CM | POA: Diagnosis not present

## 2017-04-22 DIAGNOSIS — Z1231 Encounter for screening mammogram for malignant neoplasm of breast: Secondary | ICD-10-CM | POA: Diagnosis not present

## 2017-04-22 DIAGNOSIS — Z124 Encounter for screening for malignant neoplasm of cervix: Secondary | ICD-10-CM | POA: Diagnosis not present

## 2017-04-22 DIAGNOSIS — Z3041 Encounter for surveillance of contraceptive pills: Secondary | ICD-10-CM | POA: Diagnosis not present

## 2017-04-22 DIAGNOSIS — Z1151 Encounter for screening for human papillomavirus (HPV): Secondary | ICD-10-CM | POA: Diagnosis not present

## 2017-04-22 LAB — HM PAP SMEAR: HM Pap smear: NEGATIVE

## 2017-04-30 ENCOUNTER — Encounter: Payer: Self-pay | Admitting: Family Medicine

## 2017-04-30 ENCOUNTER — Encounter: Payer: Self-pay | Admitting: *Deleted

## 2017-08-15 ENCOUNTER — Ambulatory Visit: Payer: BLUE CROSS/BLUE SHIELD | Admitting: Family Medicine

## 2017-08-15 ENCOUNTER — Encounter: Payer: Self-pay | Admitting: Family Medicine

## 2017-08-15 VITALS — Ht 62.0 in

## 2017-08-15 DIAGNOSIS — M25561 Pain in right knee: Secondary | ICD-10-CM

## 2017-10-16 NOTE — Progress Notes (Signed)
Appt cancelled

## 2018-02-11 ENCOUNTER — Telehealth: Payer: Self-pay | Admitting: Family Medicine

## 2018-02-11 DIAGNOSIS — Z13220 Encounter for screening for lipoid disorders: Secondary | ICD-10-CM

## 2018-02-11 DIAGNOSIS — D509 Iron deficiency anemia, unspecified: Secondary | ICD-10-CM

## 2018-02-11 NOTE — Telephone Encounter (Signed)
-----   Message from Lendon Collar, RT sent at 02/11/2018 11:00 AM EDT ----- Regarding: Lab orders for tomorrow 02/12/18 Please enter CPE lab orders for tomorrow 02/12/18. Thanks!

## 2018-02-12 ENCOUNTER — Other Ambulatory Visit: Payer: BLUE CROSS/BLUE SHIELD

## 2018-02-18 ENCOUNTER — Ambulatory Visit: Payer: BLUE CROSS/BLUE SHIELD

## 2018-02-19 ENCOUNTER — Encounter: Payer: BLUE CROSS/BLUE SHIELD | Admitting: Family Medicine

## 2018-03-09 DIAGNOSIS — Z808 Family history of malignant neoplasm of other organs or systems: Secondary | ICD-10-CM | POA: Diagnosis not present

## 2018-03-09 DIAGNOSIS — Z1283 Encounter for screening for malignant neoplasm of skin: Secondary | ICD-10-CM | POA: Diagnosis not present

## 2018-03-09 DIAGNOSIS — D485 Neoplasm of uncertain behavior of skin: Secondary | ICD-10-CM | POA: Diagnosis not present

## 2018-03-09 DIAGNOSIS — D229 Melanocytic nevi, unspecified: Secondary | ICD-10-CM | POA: Diagnosis not present

## 2018-04-24 DIAGNOSIS — Z130 Encounter for screening for diseases of the blood and blood-forming organs and certain disorders involving the immune mechanism: Secondary | ICD-10-CM | POA: Diagnosis not present

## 2018-04-24 DIAGNOSIS — Z6822 Body mass index (BMI) 22.0-22.9, adult: Secondary | ICD-10-CM | POA: Diagnosis not present

## 2018-04-24 DIAGNOSIS — Z01419 Encounter for gynecological examination (general) (routine) without abnormal findings: Secondary | ICD-10-CM | POA: Diagnosis not present

## 2018-04-24 DIAGNOSIS — Z1389 Encounter for screening for other disorder: Secondary | ICD-10-CM | POA: Diagnosis not present

## 2018-05-06 ENCOUNTER — Other Ambulatory Visit: Payer: BLUE CROSS/BLUE SHIELD

## 2018-05-08 ENCOUNTER — Encounter: Payer: BLUE CROSS/BLUE SHIELD | Admitting: Family Medicine

## 2018-05-29 DIAGNOSIS — Z1231 Encounter for screening mammogram for malignant neoplasm of breast: Secondary | ICD-10-CM | POA: Diagnosis not present

## 2018-07-17 ENCOUNTER — Other Ambulatory Visit: Payer: BLUE CROSS/BLUE SHIELD

## 2018-07-21 ENCOUNTER — Encounter: Payer: BLUE CROSS/BLUE SHIELD | Admitting: Family Medicine

## 2018-07-25 ENCOUNTER — Telehealth: Payer: Self-pay | Admitting: Family Medicine

## 2018-07-25 DIAGNOSIS — Z13220 Encounter for screening for lipoid disorders: Secondary | ICD-10-CM

## 2018-07-25 DIAGNOSIS — D509 Iron deficiency anemia, unspecified: Secondary | ICD-10-CM

## 2018-07-25 NOTE — Telephone Encounter (Signed)
-----   Message from Ellamae Sia sent at 07/22/2018  4:28 PM EST ----- Regarding: Lab orders for Thursday, 3.12.20 Patient is scheduled for CPX labs, please order future labs, Thanks , Karna Christmas

## 2018-07-31 ENCOUNTER — Other Ambulatory Visit: Payer: BLUE CROSS/BLUE SHIELD

## 2018-08-04 ENCOUNTER — Encounter: Payer: BLUE CROSS/BLUE SHIELD | Admitting: Family Medicine

## 2019-09-08 DIAGNOSIS — Z6821 Body mass index (BMI) 21.0-21.9, adult: Secondary | ICD-10-CM | POA: Diagnosis not present

## 2019-09-08 DIAGNOSIS — Z130 Encounter for screening for diseases of the blood and blood-forming organs and certain disorders involving the immune mechanism: Secondary | ICD-10-CM | POA: Diagnosis not present

## 2019-09-08 DIAGNOSIS — Z1389 Encounter for screening for other disorder: Secondary | ICD-10-CM | POA: Diagnosis not present

## 2019-09-08 DIAGNOSIS — Z01419 Encounter for gynecological examination (general) (routine) without abnormal findings: Secondary | ICD-10-CM | POA: Diagnosis not present

## 2019-10-20 DIAGNOSIS — H532 Diplopia: Secondary | ICD-10-CM | POA: Diagnosis not present

## 2019-12-19 DIAGNOSIS — Z20822 Contact with and (suspected) exposure to covid-19: Secondary | ICD-10-CM | POA: Diagnosis not present

## 2019-12-27 DIAGNOSIS — Z20822 Contact with and (suspected) exposure to covid-19: Secondary | ICD-10-CM | POA: Diagnosis not present

## 2019-12-30 DIAGNOSIS — Z1231 Encounter for screening mammogram for malignant neoplasm of breast: Secondary | ICD-10-CM | POA: Diagnosis not present

## 2020-01-07 ENCOUNTER — Other Ambulatory Visit: Payer: Self-pay | Admitting: Obstetrics and Gynecology

## 2020-01-07 DIAGNOSIS — R928 Other abnormal and inconclusive findings on diagnostic imaging of breast: Secondary | ICD-10-CM

## 2020-01-19 ENCOUNTER — Ambulatory Visit
Admission: RE | Admit: 2020-01-19 | Discharge: 2020-01-19 | Disposition: A | Discharge status: Home / Self Care | Payer: BC Managed Care – PPO | Source: Ambulatory Visit | Attending: Obstetrics and Gynecology | Admitting: Obstetrics and Gynecology

## 2020-01-19 ENCOUNTER — Other Ambulatory Visit: Payer: Self-pay | Admitting: Obstetrics and Gynecology

## 2020-01-19 ENCOUNTER — Other Ambulatory Visit: Payer: Self-pay

## 2020-01-19 ENCOUNTER — Ambulatory Visit
Admission: RE | Admit: 2020-01-19 | Discharge: 2020-01-19 | Disposition: A | Discharge status: Home / Self Care | Payer: BLUE CROSS/BLUE SHIELD | Source: Ambulatory Visit | Attending: Obstetrics and Gynecology | Admitting: Obstetrics and Gynecology

## 2020-01-19 DIAGNOSIS — R922 Inconclusive mammogram: Secondary | ICD-10-CM | POA: Diagnosis not present

## 2020-01-19 DIAGNOSIS — N6489 Other specified disorders of breast: Secondary | ICD-10-CM | POA: Diagnosis not present

## 2020-01-19 DIAGNOSIS — R928 Other abnormal and inconclusive findings on diagnostic imaging of breast: Secondary | ICD-10-CM

## 2020-01-31 ENCOUNTER — Ambulatory Visit
Admission: RE | Admit: 2020-01-31 | Discharge: 2020-01-31 | Disposition: A | Discharge status: Home / Self Care | Payer: BC Managed Care – PPO | Source: Ambulatory Visit | Attending: Obstetrics and Gynecology | Admitting: Obstetrics and Gynecology

## 2020-01-31 ENCOUNTER — Other Ambulatory Visit: Payer: Self-pay

## 2020-01-31 DIAGNOSIS — N6489 Other specified disorders of breast: Secondary | ICD-10-CM

## 2020-01-31 DIAGNOSIS — R928 Other abnormal and inconclusive findings on diagnostic imaging of breast: Secondary | ICD-10-CM | POA: Diagnosis not present

## 2020-01-31 DIAGNOSIS — N6012 Diffuse cystic mastopathy of left breast: Secondary | ICD-10-CM | POA: Diagnosis not present

## 2020-01-31 HISTORY — PX: BREAST BIOPSY: SHX20

## 2020-03-06 ENCOUNTER — Encounter: Payer: Self-pay | Admitting: Dermatology

## 2020-03-06 ENCOUNTER — Other Ambulatory Visit: Payer: Self-pay

## 2020-03-06 ENCOUNTER — Ambulatory Visit: Payer: BC Managed Care – PPO | Admitting: Dermatology

## 2020-03-06 DIAGNOSIS — D485 Neoplasm of uncertain behavior of skin: Secondary | ICD-10-CM

## 2020-03-06 DIAGNOSIS — L821 Other seborrheic keratosis: Secondary | ICD-10-CM | POA: Diagnosis not present

## 2020-03-06 DIAGNOSIS — Z86018 Personal history of other benign neoplasm: Secondary | ICD-10-CM

## 2020-03-06 DIAGNOSIS — D1800 Hemangioma unspecified site: Secondary | ICD-10-CM

## 2020-03-06 DIAGNOSIS — Z1283 Encounter for screening for malignant neoplasm of skin: Secondary | ICD-10-CM | POA: Diagnosis not present

## 2020-03-06 DIAGNOSIS — D229 Melanocytic nevi, unspecified: Secondary | ICD-10-CM

## 2020-03-06 DIAGNOSIS — D2272 Melanocytic nevi of left lower limb, including hip: Secondary | ICD-10-CM | POA: Diagnosis not present

## 2020-03-06 DIAGNOSIS — D492 Neoplasm of unspecified behavior of bone, soft tissue, and skin: Secondary | ICD-10-CM

## 2020-03-06 DIAGNOSIS — D2262 Melanocytic nevi of left upper limb, including shoulder: Secondary | ICD-10-CM | POA: Diagnosis not present

## 2020-03-06 DIAGNOSIS — L814 Other melanin hyperpigmentation: Secondary | ICD-10-CM

## 2020-03-06 DIAGNOSIS — Q825 Congenital non-neoplastic nevus: Secondary | ICD-10-CM | POA: Diagnosis not present

## 2020-03-06 DIAGNOSIS — L578 Other skin changes due to chronic exposure to nonionizing radiation: Secondary | ICD-10-CM

## 2020-03-06 HISTORY — DX: Personal history of other benign neoplasm: Z86.018

## 2020-03-06 NOTE — Patient Instructions (Signed)
Wound Care Instructions  1. Cleanse wound gently with soap and water once a day then pat dry with clean gauze. Apply a thing coat of Petrolatum (petroleum jelly, "Vaseline") over the wound (unless you have an allergy to this). We recommend that you use a new, sterile tube of Vaseline. Do not pick or remove scabs. Do not remove the yellow or white "healing tissue" from the base of the wound.  2. Cover the wound with fresh, clean, nonstick gauze and secure with paper tape. You may use Band-Aids in place of gauze and tape if the would is small enough, but would recommend trimming much of the tape off as there is often too much. Sometimes Band-Aids can irritate the skin.  3. You should call the office for your biopsy report after 1 week if you have not already been contacted.  4. If you experience any problems, such as abnormal amounts of bleeding, swelling, significant bruising, significant pain, or evidence of infection, please call the office immediately.  5. FOR ADULT SURGERY PATIENTS: If you need something for pain relief you may take 1 extra strength Tylenol (acetaminophen) AND 2 Ibuprofen (200mg each) together every 4 hours as needed for pain. (do not take these if you are allergic to them or if you have a reason you should not take them.) Typically, you may only need pain medication for 1 to 3 days.   Melanoma ABCDEs  Melanoma is the most dangerous type of skin cancer, and is the leading cause of death from skin disease.  You are more likely to develop melanoma if you:  Have light-colored skin, light-colored eyes, or red or blond hair  Spend a lot of time in the sun  Tan regularly, either outdoors or in a tanning bed  Have had blistering sunburns, especially during childhood  Have a close family member who has had a melanoma  Have atypical moles or large birthmarks  Early detection of melanoma is key since treatment is typically straightforward and cure rates are extremely high if we  catch it early.   The first sign of melanoma is often a change in a mole or a new dark spot.  The ABCDE system is a way of remembering the signs of melanoma.  A for asymmetry:  The two halves do not match. B for border:  The edges of the growth are irregular. C for color:  A mixture of colors are present instead of an even brown color. D for diameter:  Melanomas are usually (but not always) greater than 6mm - the size of a pencil eraser. E for evolution:  The spot keeps changing in size, shape, and color.  Please check your skin once per month between visits. You can use a small mirror in front and a large mirror behind you to keep an eye on the back side or your body.   If you see any new or changing lesions before your next follow-up, please call to schedule a visit.  Please continue daily skin protection including broad spectrum sunscreen SPF 30+ to sun-exposed areas, reapplying every 2 hours as needed when you're outdoors.     

## 2020-03-06 NOTE — Progress Notes (Signed)
Follow-Up Visit   Subjective  Connie Rosales is a 44 y.o. female who presents for the following: Annual Exam (total body skin exam, hx of dysplastic nevi right upper back spinal, left ant prox thigh near groin) and check spot (L thigh, pt not sure how long it has been there, no symptoms).  She has noticed that is has irregular shape and color.   The following portions of the chart were reviewed this encounter and updated as appropriate:      Review of Systems:  No other skin or systemic complaints except as noted in HPI or Assessment and Plan.  Objective  Well appearing patient in no apparent distress; mood and affect are within normal limits.  A full examination was performed including scalp, head, eyes, ears, nose, lips, neck, chest, axillae, abdomen, back, buttocks, bilateral upper extremities, bilateral lower extremities, hands, feet, fingers, toes, fingernails, and toenails. All findings within normal limits unless otherwise noted below.  Objective  right upper back spinal, left ant prox thigh near groin: Scar with no evidence of recurrence.   Objective  Left spinal lower back: 1.0cm speckled brown patch- present since childhood, no changes that she is aware of  Images    Objective  L post shoulder: 3.75mm brown macule lighter central     Objective  Left Thigh - Anterior: 4.56mm light brown macule darker center      Assessment & Plan    Seborrheic Keratoses - Stuck-on, waxy, tan-brown papules and plaques  - Discussed benign etiology and prognosis. - Observe - Call for any changes   Skin cancer screening performed today.  Melanocytic Nevi - Tan-brown and/or pink-flesh-colored symmetric macules and papules - Benign appearing on exam today - Observation - Call clinic for new or changing moles - Recommend daily use of broad spectrum spf 30+ sunscreen to sun-exposed areas.   Lentigines - Scattered tan macules - Discussed due to sun exposure -  Benign, observe - Call for any changes  Hemangiomas - Red papules - Discussed benign nature - Observe - Call for any changes -L upper eyelid discussed BBL $200 for one lesion  Actinic Damage - diffuse erythematous macules with underlying dyspigmentation - Recommend daily broad spectrum sunscreen SPF 30+ to sun-exposed areas, reapply every 2 hours as needed.  - Call for new or changing lesions.    History of dysplastic nevus right upper back spinal, left ant prox thigh near groin  Clear. Observe for recurrence. Call clinic for new or changing lesions.  Recommend regular skin exams, daily broad-spectrum spf 30+ sunscreen use, and photoprotection.     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  Observation.  Call clinic for new or changing moles.  Recommend daily use of broad spectrum spf 30+ sunscreen to sun-exposed areas.    Neoplasm of skin (2) L post shoulder  Epidermal / dermal shaving  Lesion diameter (cm):  0.3 Informed consent: discussed and consent obtained   Patient was prepped and draped in usual sterile fashion: area prepped with alcohol. Anesthesia: the lesion was anesthetized in a standard fashion   Anesthetic:  1% lidocaine w/ epinephrine 1-100,000 buffered w/ 8.4% NaHCO3 Instrument used: flexible razor blade   Hemostasis achieved with: pressure, aluminum chloride and electrodesiccation   Outcome: patient tolerated procedure well   Post-procedure details: wound care instructions given   Post-procedure details comment:  Ointment and small bandage applied  Specimen 2 - Surgical pathology Differential Diagnosis: D48.5 Nevus vs Dysplastic Nevus Check Margins: yes 3.16mm  brown macule lighter central  Left Thigh - Anterior  Epidermal / dermal shaving  Lesion diameter (cm):  0.4 Informed consent: discussed and consent obtained   Patient was prepped and draped in usual sterile fashion: area prepped with alcohol. Anesthesia: the  lesion was anesthetized in a standard fashion   Anesthetic:  1% lidocaine w/ epinephrine 1-100,000 buffered w/ 8.4% NaHCO3 Instrument used: flexible razor blade   Hemostasis achieved with: pressure, aluminum chloride and electrodesiccation   Outcome: patient tolerated procedure well   Post-procedure details: wound care instructions given   Post-procedure details comment:  Ointment and small bandage applied  Specimen 1 - Surgical pathology Differential Diagnosis: D48.5 Nevus vs Dysplastic Nevus Check Margins: yes 4.71mm light brown macule darker center  Return in about 1 year (around 03/06/2021) for TBSE hx of dysplastic nevi.  I, Othelia Pulling, RMA, am acting as scribe for Brendolyn Patty, MD . Documentation: I have reviewed the above documentation for accuracy and completeness, and I agree with the above.  Brendolyn Patty MD

## 2020-03-13 ENCOUNTER — Telehealth: Payer: Self-pay

## 2020-03-13 NOTE — Telephone Encounter (Signed)
Advised pt of bx results/sh ?

## 2020-03-13 NOTE — Telephone Encounter (Signed)
-----   Message from Brendolyn Patty, MD sent at 03/13/2020  9:54 AM EDT ----- 1. Skin , left thigh-anterior DYSPLASTIC COMPOUND NEVUS WITH MILD ATYPIA, DEEP MARGIN INVOLVED 2. 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 Mildly atypical mole- observe  2. Moderately atypical mole- observe for recurrence

## 2020-04-24 ENCOUNTER — Other Ambulatory Visit: Payer: Self-pay

## 2020-04-24 ENCOUNTER — Ambulatory Visit (INDEPENDENT_AMBULATORY_CARE_PROVIDER_SITE_OTHER): Payer: Self-pay

## 2020-04-24 DIAGNOSIS — D1801 Hemangioma of skin and subcutaneous tissue: Secondary | ICD-10-CM

## 2020-05-29 ENCOUNTER — Telehealth: Payer: Self-pay | Admitting: Family Medicine

## 2020-05-29 DIAGNOSIS — D509 Iron deficiency anemia, unspecified: Secondary | ICD-10-CM

## 2020-05-29 DIAGNOSIS — Z13220 Encounter for screening for lipoid disorders: Secondary | ICD-10-CM

## 2020-05-29 NOTE — Telephone Encounter (Signed)
-----   Message from Cloyd Stagers, RT sent at 05/15/2020  3:18 PM EST ----- Regarding: Lab Orders for Wednesday 1.12.2022 Please place lab orders for Wednesday 1.12.2022, office visit for physical on Friday 1.14.2022 .Thank you, Dyke Maes RT(R)

## 2020-05-31 ENCOUNTER — Other Ambulatory Visit: Payer: BC Managed Care – PPO

## 2020-06-02 ENCOUNTER — Encounter: Payer: BC Managed Care – PPO | Admitting: Family Medicine

## 2020-07-04 DIAGNOSIS — Z20822 Contact with and (suspected) exposure to covid-19: Secondary | ICD-10-CM | POA: Diagnosis not present

## 2020-07-18 ENCOUNTER — Other Ambulatory Visit: Payer: Self-pay

## 2020-07-18 ENCOUNTER — Other Ambulatory Visit (INDEPENDENT_AMBULATORY_CARE_PROVIDER_SITE_OTHER): Payer: BC Managed Care – PPO

## 2020-07-18 DIAGNOSIS — Z13220 Encounter for screening for lipoid disorders: Secondary | ICD-10-CM

## 2020-07-18 DIAGNOSIS — D509 Iron deficiency anemia, unspecified: Secondary | ICD-10-CM

## 2020-07-18 LAB — LIPID PANEL
Cholesterol: 196 mg/dL (ref 0–200)
HDL: 80.9 mg/dL (ref >39.00)
LDL Cholesterol: 99 mg/dL (ref 0–99)
NonHDL: 115.02
Total CHOL/HDL Ratio: 2
Triglycerides: 78 mg/dL (ref 0.0–149.0)
VLDL: 15.6 mg/dL (ref 0.0–40.0)

## 2020-07-18 LAB — COMPREHENSIVE METABOLIC PANEL
ALT: 11 U/L (ref 0–35)
AST: 16 U/L (ref 0–37)
Albumin: 4.2 g/dL (ref 3.5–5.2)
Alkaline Phosphatase: 62 U/L (ref 39–117)
BUN: 9 mg/dL (ref 6–23)
CO2: 26 mEq/L (ref 19–32)
Calcium: 8.9 mg/dL (ref 8.4–10.5)
Chloride: 104 mEq/L (ref 96–112)
Creatinine, Ser: 0.74 mg/dL (ref 0.40–1.20)
GFR: 97.91 mL/min (ref >60.00)
Glucose, Bld: 92 mg/dL (ref 70–99)
Potassium: 4.1 mEq/L (ref 3.5–5.1)
Sodium: 138 mEq/L (ref 135–145)
Total Bilirubin: 0.6 mg/dL (ref 0.2–1.2)
Total Protein: 7.3 g/dL (ref 6.0–8.3)

## 2020-07-18 LAB — CBC WITH DIFFERENTIAL/PLATELET
Basophils Absolute: 0 10*3/uL (ref 0.0–0.1)
Basophils Relative: 0.6 % (ref 0.0–3.0)
Eosinophils Absolute: 0.4 10*3/uL (ref 0.0–0.7)
Eosinophils Relative: 9.4 % — ABNORMAL HIGH (ref 0.0–5.0)
HCT: 37.3 % (ref 36.0–46.0)
Hemoglobin: 12.6 g/dL (ref 12.0–15.0)
Lymphocytes Relative: 35.5 % (ref 12.0–46.0)
Lymphs Abs: 1.6 10*3/uL (ref 0.7–4.0)
MCHC: 33.7 g/dL (ref 30.0–36.0)
MCV: 94.3 fl (ref 78.0–100.0)
Monocytes Absolute: 0.3 10*3/uL (ref 0.1–1.0)
Monocytes Relative: 5.9 % (ref 3.0–12.0)
Neutro Abs: 2.1 10*3/uL (ref 1.4–7.7)
Neutrophils Relative %: 48.6 % (ref 43.0–77.0)
Platelets: 262 10*3/uL (ref 150.0–400.0)
RBC: 3.95 Mil/uL (ref 3.87–5.11)
RDW: 12.5 % (ref 11.5–15.5)
WBC: 4.4 10*3/uL (ref 4.0–10.5)

## 2020-07-19 NOTE — Progress Notes (Signed)
No critical labs need to be addressed urgently. We will discuss labs in detail at upcoming office visit.   

## 2020-07-25 ENCOUNTER — Other Ambulatory Visit: Payer: Self-pay

## 2020-07-25 ENCOUNTER — Ambulatory Visit (INDEPENDENT_AMBULATORY_CARE_PROVIDER_SITE_OTHER): Payer: BC Managed Care – PPO | Admitting: Family Medicine

## 2020-07-25 ENCOUNTER — Encounter: Payer: Self-pay | Admitting: Family Medicine

## 2020-07-25 VITALS — BP 110/60 | HR 78 | Temp 97.9°F | Ht 62.25 in | Wt 120.2 lb

## 2020-07-25 DIAGNOSIS — D509 Iron deficiency anemia, unspecified: Secondary | ICD-10-CM

## 2020-07-25 DIAGNOSIS — R040 Epistaxis: Secondary | ICD-10-CM

## 2020-07-25 DIAGNOSIS — Z1211 Encounter for screening for malignant neoplasm of colon: Secondary | ICD-10-CM | POA: Diagnosis not present

## 2020-07-25 DIAGNOSIS — Z1159 Encounter for screening for other viral diseases: Secondary | ICD-10-CM

## 2020-07-25 NOTE — Assessment & Plan Note (Signed)
Resolved

## 2020-07-25 NOTE — Patient Instructions (Addendum)
Please stop at the lab to have labs drawn. Pick up stool cards in lab on way out.  We will set up referral to ENT for nosebleeds.

## 2020-07-25 NOTE — Progress Notes (Signed)
Patient ID: Connie Rosales, female    DOB: [birth year], 45 y.o.   MRN: [id number]  This visit was conducted in person.  BP 110/60   Pulse 78   Temp 97.9 F (36.6 C) (Temporal)   Ht 5' 2.25" (1.581 m)   Wt 120 lb 4 oz (54.5 kg)   SpO2 99%   BMI 21.82 kg/m    CC:  Chief Complaint  Patient presents with  . Annual Exam    Subjective:   HPI: Connie Rosales is a 45 y.o. female presenting on 07/25/2020 for Annual Exam   She reports in last 5 months she has weekly nosebleeds. Occurring in left anterior nares.  Bad weeks she has 2-3 bleeds per week.  She uses Vaseline, saline. Lasts  10-15 min.  No gum bleeding.  No weight loss, no night sweats. Menses very light given Loestrin.   no personal or family history of clotting disorder.  BP Readings from Last 3 Encounters:  07/25/20 110/60  02/18/17 108/70  02/09/16 116/60    She is vegetarian. Keeping up with beans, dairy.  Exercise: Yoga 2 times a week,  Running  Once a week.   Limited some by knee pain.. using glucosamine.  The 10-year ASCVD risk score Mikey Bussing DC Brooke Bonito., et al., 2013) is: 0.3%   Values used to calculate the score:     Age: 7 years     Sex: Female     Is Non-Hispanic African American: No     Diabetic: No     Tobacco smoker: No     Systolic Blood Pressure: 007 mmHg     Is BP treated: No     HDL Cholesterol: 80.9 mg/dL     Total Cholesterol: 196 mg/dL   Relevant past medical, surgical, family and social history reviewed and updated as indicated. Interim medical history since our last visit reviewed. Allergies and medications reviewed and updated. Outpatient Medications Prior to Visit  Medication Sig Dispense Refill  . LO LOESTRIN FE 1 MG-10 MCG / 10 MCG tablet Take 1 tablet by mouth daily.  11  . Multiple Vitamin (MULTIVITAMIN) tablet Take 1 tablet by mouth daily.     No facility-administered medications prior to visit.     Per HPI unless specifically indicated in ROS section below Review of  Systems  Constitutional: Negative for fatigue and fever.  HENT: Negative for congestion.   Eyes: Negative for pain.  Respiratory: Negative for cough and shortness of breath.   Cardiovascular: Negative for chest pain, palpitations and leg swelling.  Gastrointestinal: Negative for abdominal pain.  Genitourinary: Negative for dysuria and vaginal bleeding.  Musculoskeletal: Negative for back pain.  Neurological: Negative for syncope, light-headedness and headaches.  Psychiatric/Behavioral: Negative for dysphoric mood.   Objective:  BP 110/60   Pulse 78   Temp 97.9 F (36.6 C) (Temporal)   Ht 5' 2.25" (1.581 m)   Wt 120 lb 4 oz (54.5 kg)   SpO2 99%   BMI 21.82 kg/m   Wt Readings from Last 3 Encounters:  07/25/20 120 lb 4 oz (54.5 kg)  02/18/17 119 lb (54 kg)  02/09/16 119 lb (54 kg)      Physical Exam    Results for orders placed or performed in visit on 07/18/20  Comprehensive metabolic panel  Result Value Ref Range   Sodium 138 135 - 145 mEq/L   Potassium 4.1 3.5 - 5.1 mEq/L   Chloride 104 96 - 112 mEq/L   CO2 26  19 - 32 mEq/L   Glucose, Bld 92 70 - 99 mg/dL   BUN 9 6 - 23 mg/dL   Creatinine, Ser 0.74 0.40 - 1.20 mg/dL   Total Bilirubin 0.6 0.2 - 1.2 mg/dL   Alkaline Phosphatase 62 39 - 117 U/L   AST 16 0 - 37 U/L   ALT 11 0 - 35 U/L   Total Protein 7.3 6.0 - 8.3 g/dL   Albumin 4.2 3.5 - 5.2 g/dL   GFR 97.91 >60.00 mL/min   Calcium 8.9 8.4 - 10.5 mg/dL  Lipid panel  Result Value Ref Range   Cholesterol 196 0 - 200 mg/dL   Triglycerides 78.0 0.0 - 149.0 mg/dL   HDL 80.90 >39.00 mg/dL   VLDL 15.6 0.0 - 40.0 mg/dL   LDL Cholesterol 99 0 - 99 mg/dL   Total CHOL/HDL Ratio 2    NonHDL 115.02   CBC with Differential/Platelet  Result Value Ref Range   WBC 4.4 4.0 - 10.5 K/uL   RBC 3.95 3.87 - 5.11 Mil/uL   Hemoglobin 12.6 12.0 - 15.0 g/dL   HCT 37.3 36.0 - 46.0 %   MCV 94.3 78.0 - 100.0 fl   MCHC 33.7 30.0 - 36.0 g/dL   RDW 12.5 11.5 - 15.5 %   Platelets 262.0  150.0 - 400.0 K/uL   Neutrophils Relative % 48.6 43.0 - 77.0 %   Lymphocytes Relative 35.5 12.0 - 46.0 %   Monocytes Relative 5.9 3.0 - 12.0 %   Eosinophils Relative 9.4 (H) 0.0 - 5.0 %   Basophils Relative 0.6 0.0 - 3.0 %   Neutro Abs 2.1 1.4 - 7.7 K/uL   Lymphs Abs 1.6 0.7 - 4.0 K/uL   Monocytes Absolute 0.3 0.1 - 1.0 K/uL   Eosinophils Absolute 0.4 0.0 - 0.7 K/uL   Basophils Absolute 0.0 0.0 - 0.1 K/uL    This visit occurred during the SARS-CoV-2 public health emergency.  Safety protocols were in place, including screening questions prior to the visit, additional usage of staff PPE, and extensive cleaning of exam room while observing appropriate contact time as indicated for disinfecting solutions.   COVID 19 screen:  No recent travel or known exposure to COVID19 The patient denies respiratory symptoms of COVID 19 at this time. The importance of social distancing was discussed today.   Assessment and Plan The patient's preventative maintenance and recommended screening tests for an annual wellness exam were reviewed in full today. Brought up to date unless services declined.  Counselled on the importance of diet, exercise, and its role in overall health and mortality. The patient's FH and SH was reviewed, including their home life, tobacco status, and drug and alcohol status.   Vaccines:  Tdap 2018, Uptodate with COVId and flu. PAP/DVE: Sees Dr. Marvel Plan.  Mammogram every year planned. (01/2020 No early family history of breast cancer or colon cancer. COLON: due, plan yearly ifob. Nonsmoker. HIV: refused  hep C: due  Problem List Items Addressed This Visit    Epistaxis - Primary     Recurrent.  No anemia. Normal platelets.  Eval PT/INR. Given vegetarian .. will make sure she is not low iron as well. Refer to ENT for treatment options.      Relevant Orders   Ambulatory referral to ENT   PT AND PTT   IBC + Ferritin   Iron deficiency anemia    Resolved.        Other Visit Diagnoses    Need for hepatitis C  screening test       Relevant Orders   Hepatitis C antibody   Colon cancer screening       Relevant Orders   Fecal occult blood, imunochemical      Eliezer Lofts, MD

## 2020-07-25 NOTE — Assessment & Plan Note (Signed)
Recurrent.  No anemia. Normal platelets.  Eval PT/INR. Given vegetarian .. will make sure she is not low iron as well. Refer to ENT for treatment options.

## 2020-07-26 LAB — HEPATITIS C ANTIBODY
Hepatitis C Ab: NONREACTIVE
SIGNAL TO CUT-OFF: 0.01 (ref <1.00)

## 2020-07-26 LAB — PT AND PTT
INR: 1 (ref 0.9–1.2)
Prothrombin Time: 10 s (ref 9.1–12.0)
aPTT: 27 s (ref 24–33)

## 2020-07-26 NOTE — Progress Notes (Signed)
No critical labs need to be addressed urgently. We will discuss labs in detail at upcoming office visit.   

## 2020-07-27 ENCOUNTER — Other Ambulatory Visit (INDEPENDENT_AMBULATORY_CARE_PROVIDER_SITE_OTHER): Payer: BC Managed Care – PPO

## 2020-07-27 DIAGNOSIS — Z1211 Encounter for screening for malignant neoplasm of colon: Secondary | ICD-10-CM | POA: Diagnosis not present

## 2020-07-27 LAB — IBC + FERRITIN
Ferritin: 46.9 ng/mL (ref 10.0–291.0)
Iron: 106 ug/dL (ref 42–145)
Saturation Ratios: 26.5 % (ref 20.0–50.0)
Transferrin: 286 mg/dL (ref 212.0–360.0)

## 2020-07-27 LAB — FECAL OCCULT BLOOD, IMMUNOCHEMICAL: Fecal Occult Bld: NEGATIVE

## 2020-08-02 DIAGNOSIS — J3489 Other specified disorders of nose and nasal sinuses: Secondary | ICD-10-CM | POA: Diagnosis not present

## 2020-08-02 DIAGNOSIS — R040 Epistaxis: Secondary | ICD-10-CM | POA: Diagnosis not present

## 2020-08-02 DIAGNOSIS — J342 Deviated nasal septum: Secondary | ICD-10-CM | POA: Diagnosis not present

## 2020-08-18 DIAGNOSIS — J342 Deviated nasal septum: Secondary | ICD-10-CM | POA: Diagnosis not present

## 2020-08-18 DIAGNOSIS — R040 Epistaxis: Secondary | ICD-10-CM | POA: Diagnosis not present

## 2020-11-09 DIAGNOSIS — M950 Acquired deformity of nose: Secondary | ICD-10-CM | POA: Diagnosis not present

## 2020-11-09 DIAGNOSIS — J342 Deviated nasal septum: Secondary | ICD-10-CM | POA: Insufficient documentation

## 2020-11-09 DIAGNOSIS — J3489 Other specified disorders of nose and nasal sinuses: Secondary | ICD-10-CM | POA: Diagnosis not present

## 2021-01-04 DIAGNOSIS — M950 Acquired deformity of nose: Secondary | ICD-10-CM | POA: Diagnosis not present

## 2021-01-04 DIAGNOSIS — J342 Deviated nasal septum: Secondary | ICD-10-CM | POA: Diagnosis not present

## 2021-01-09 DIAGNOSIS — Z01419 Encounter for gynecological examination (general) (routine) without abnormal findings: Secondary | ICD-10-CM | POA: Diagnosis not present

## 2021-01-09 DIAGNOSIS — Z6822 Body mass index (BMI) 22.0-22.9, adult: Secondary | ICD-10-CM | POA: Diagnosis not present

## 2021-01-09 DIAGNOSIS — Z1151 Encounter for screening for human papillomavirus (HPV): Secondary | ICD-10-CM | POA: Diagnosis not present

## 2021-01-09 DIAGNOSIS — Z124 Encounter for screening for malignant neoplasm of cervix: Secondary | ICD-10-CM | POA: Diagnosis not present

## 2021-01-09 LAB — HM PAP SMEAR: HM Pap smear: NEGATIVE

## 2021-01-09 LAB — RESULTS CONSOLE HPV: CHL HPV: NEGATIVE

## 2021-01-17 ENCOUNTER — Encounter: Payer: Self-pay | Admitting: Family Medicine

## 2021-02-02 DIAGNOSIS — Z1231 Encounter for screening mammogram for malignant neoplasm of breast: Secondary | ICD-10-CM | POA: Diagnosis not present

## 2021-03-06 ENCOUNTER — Encounter: Payer: Self-pay | Admitting: Dermatology

## 2021-03-06 ENCOUNTER — Ambulatory Visit: Payer: BC Managed Care – PPO | Admitting: Dermatology

## 2021-03-06 ENCOUNTER — Other Ambulatory Visit: Payer: Self-pay

## 2021-03-06 DIAGNOSIS — D1800 Hemangioma unspecified site: Secondary | ICD-10-CM

## 2021-03-06 DIAGNOSIS — Z86018 Personal history of other benign neoplasm: Secondary | ICD-10-CM

## 2021-03-06 DIAGNOSIS — Z1283 Encounter for screening for malignant neoplasm of skin: Secondary | ICD-10-CM | POA: Diagnosis not present

## 2021-03-06 DIAGNOSIS — L578 Other skin changes due to chronic exposure to nonionizing radiation: Secondary | ICD-10-CM

## 2021-03-06 DIAGNOSIS — D2272 Melanocytic nevi of left lower limb, including hip: Secondary | ICD-10-CM

## 2021-03-06 DIAGNOSIS — Q825 Congenital non-neoplastic nevus: Secondary | ICD-10-CM

## 2021-03-06 DIAGNOSIS — L821 Other seborrheic keratosis: Secondary | ICD-10-CM

## 2021-03-06 DIAGNOSIS — D692 Other nonthrombocytopenic purpura: Secondary | ICD-10-CM

## 2021-03-06 DIAGNOSIS — D229 Melanocytic nevi, unspecified: Secondary | ICD-10-CM

## 2021-03-06 DIAGNOSIS — D225 Melanocytic nevi of trunk: Secondary | ICD-10-CM

## 2021-03-06 DIAGNOSIS — L814 Other melanin hyperpigmentation: Secondary | ICD-10-CM

## 2021-03-06 NOTE — Progress Notes (Signed)
   Follow-Up Visit   Subjective  Connie Rosales is a 45 y.o. female who presents for the following: Follow-up (Patient here today for full body exam. Reports a spot at left thigh she would like checked today. ). She has h/o dysplastic nevi  Patient here for full body skin exam and skin cancer screening.   The following portions of the chart were reviewed this encounter and updated as appropriate:     Review of Systems: No other skin or systemic complaints except as noted in HPI or Assessment and Plan.   Objective  Well appearing patient in no apparent distress; mood and affect are within normal limits.  A full examination was performed including scalp, head, eyes, ears, nose, lips, neck, chest, axillae, abdomen, back, buttocks, bilateral upper extremities, bilateral lower extremities, hands, feet, fingers, toes, fingernails, and toenails. All findings within normal limits unless otherwise noted below.   Assessment & Plan  Nevus (2) Left Thigh - Anterior; right sternum  Benign-appearing.  Observation.  Call clinic for new or changing lesions.  Recommend daily use of broad spectrum spf 30+ sunscreen to sun-exposed areas.   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  Stable. Observation.  Call clinic for new or changing lesions.  Recommend daily use of broad spectrum spf 30+ sunscreen to sun-exposed areas.   Lentigines - Scattered tan macules - Due to sun exposure - Benign-appearing, observe - Recommend daily broad spectrum sunscreen SPF 30+ to sun-exposed areas, reapply every 2 hours as needed. - Call for any changes  Purpura, Toenail  - Violaceous macule at left distal great toenail  - Benign - Related to trauma, growing out per pt - Observe - Call for worsening or other concerns  Seborrheic Keratoses - Stuck-on, waxy, tan-brown papules and/or plaques  - Benign-appearing - Discussed benign etiology and  prognosis. - Observe - Call for any changes  Melanocytic Nevi - Tan-brown and/or pink-flesh-colored symmetric macules and papules - Benign appearing on exam today - Observation - Call clinic for new or changing moles - Recommend daily use of broad spectrum spf 30+ sunscreen to sun-exposed areas.   Hemangiomas - Red papules - Discussed benign nature - Observe - Call for any changes  Actinic Damage - Chronic condition, secondary to cumulative UV/sun exposure - diffuse scaly erythematous macules with underlying dyspigmentation - Recommend daily broad spectrum sunscreen SPF 30+ to sun-exposed areas, reapply every 2 hours as needed.  - Staying in the shade or wearing long sleeves, sun glasses (UVA+UVB protection) and wide brim hats (4-inch brim around the entire circumference of the hat) are also recommended for sun protection.  - Call for new or changing lesions.  History of Dysplastic Nevi - No evidence of recurrence today right upper back spinal, and left anterior proximal thigh near groin.  - Recommend regular full body skin exams - Recommend daily broad spectrum sunscreen SPF 30+ to sun-exposed areas, reapply every 2 hours as needed.  - Call if any new or changing lesions are noted between office visits  Skin cancer screening performed today.  Return in about 1 year (around 03/06/2022) for tbse . I, Ruthell Rummage, CMA, am acting as scribe for Brendolyn Patty, MD.  Documentation: I have reviewed the above documentation for accuracy and completeness, and I agree with the above.  Brendolyn Patty MD

## 2021-03-06 NOTE — Patient Instructions (Addendum)

## 2021-07-25 ENCOUNTER — Other Ambulatory Visit: Payer: BC Managed Care – PPO

## 2021-08-03 ENCOUNTER — Encounter: Payer: BC Managed Care – PPO | Admitting: Family Medicine

## 2021-08-22 ENCOUNTER — Telehealth: Payer: Self-pay | Admitting: Family Medicine

## 2021-08-22 DIAGNOSIS — D509 Iron deficiency anemia, unspecified: Secondary | ICD-10-CM

## 2021-08-22 DIAGNOSIS — Z13220 Encounter for screening for lipoid disorders: Secondary | ICD-10-CM

## 2021-08-22 NOTE — Telephone Encounter (Signed)
-----   Message from Velna Hatchet, RT sent at 08/14/2021  2:30 PM EDT ----- ?Regarding: Lab orders for Monday, 08/27/21 ?Patient is scheduled for cpx, please order future labs.  Thanks, Anda Kraft  ? ?

## 2021-08-27 ENCOUNTER — Other Ambulatory Visit (INDEPENDENT_AMBULATORY_CARE_PROVIDER_SITE_OTHER): Payer: BC Managed Care – PPO

## 2021-08-27 DIAGNOSIS — D509 Iron deficiency anemia, unspecified: Secondary | ICD-10-CM | POA: Diagnosis not present

## 2021-08-27 DIAGNOSIS — Z13220 Encounter for screening for lipoid disorders: Secondary | ICD-10-CM

## 2021-08-27 LAB — COMPREHENSIVE METABOLIC PANEL
ALT: 12 U/L (ref 0–35)
AST: 18 U/L (ref 0–37)
Albumin: 4.2 g/dL (ref 3.5–5.2)
Alkaline Phosphatase: 73 U/L (ref 39–117)
BUN: 9 mg/dL (ref 6–23)
CO2: 27 mEq/L (ref 19–32)
Calcium: 8.9 mg/dL (ref 8.4–10.5)
Chloride: 105 mEq/L (ref 96–112)
Creatinine, Ser: 0.72 mg/dL (ref 0.40–1.20)
GFR: 100.4 mL/min (ref >60.00)
Glucose, Bld: 86 mg/dL (ref 70–99)
Potassium: 3.9 mEq/L (ref 3.5–5.1)
Sodium: 138 mEq/L (ref 135–145)
Total Bilirubin: 0.4 mg/dL (ref 0.2–1.2)
Total Protein: 7 g/dL (ref 6.0–8.3)

## 2021-08-27 LAB — LIPID PANEL
Cholesterol: 198 mg/dL (ref 0–200)
HDL: 82.9 mg/dL (ref >39.00)
LDL Cholesterol: 94 mg/dL (ref 0–99)
NonHDL: 115.3
Total CHOL/HDL Ratio: 2
Triglycerides: 106 mg/dL (ref 0.0–149.0)
VLDL: 21.2 mg/dL (ref 0.0–40.0)

## 2021-08-27 LAB — CBC WITH DIFFERENTIAL/PLATELET
Basophils Absolute: 0.1 10*3/uL (ref 0.0–0.1)
Basophils Relative: 1.1 % (ref 0.0–3.0)
Eosinophils Absolute: 0.4 10*3/uL (ref 0.0–0.7)
Eosinophils Relative: 7.5 % — ABNORMAL HIGH (ref 0.0–5.0)
HCT: 36.9 % (ref 36.0–46.0)
Hemoglobin: 12.5 g/dL (ref 12.0–15.0)
Lymphocytes Relative: 43.3 % (ref 12.0–46.0)
Lymphs Abs: 2.1 10*3/uL (ref 0.7–4.0)
MCHC: 34 g/dL (ref 30.0–36.0)
MCV: 94.9 fl (ref 78.0–100.0)
Monocytes Absolute: 0.2 10*3/uL (ref 0.1–1.0)
Monocytes Relative: 5.1 % (ref 3.0–12.0)
Neutro Abs: 2.1 10*3/uL (ref 1.4–7.7)
Neutrophils Relative %: 43 % (ref 43.0–77.0)
Platelets: 248 10*3/uL (ref 150.0–400.0)
RBC: 3.88 Mil/uL (ref 3.87–5.11)
RDW: 12.3 % (ref 11.5–15.5)
WBC: 4.8 10*3/uL (ref 4.0–10.5)

## 2021-08-27 LAB — IBC + FERRITIN
Ferritin: 40.2 ng/mL (ref 10.0–291.0)
Iron: 100 ug/dL (ref 42–145)
Saturation Ratios: 27.5 % (ref 20.0–50.0)
TIBC: 364 ug/dL (ref 250.0–450.0)
Transferrin: 260 mg/dL (ref 212.0–360.0)

## 2021-08-28 NOTE — Progress Notes (Signed)
No critical labs need to be addressed urgently. We will discuss labs in detail at upcoming office visit.   

## 2021-08-31 ENCOUNTER — Encounter: Payer: Self-pay | Admitting: Family Medicine

## 2021-08-31 ENCOUNTER — Ambulatory Visit (INDEPENDENT_AMBULATORY_CARE_PROVIDER_SITE_OTHER): Payer: BC Managed Care – PPO | Admitting: Family Medicine

## 2021-08-31 VITALS — BP 102/60 | HR 68 | Ht 62.5 in | Wt 120.0 lb

## 2021-08-31 DIAGNOSIS — Z1211 Encounter for screening for malignant neoplasm of colon: Secondary | ICD-10-CM | POA: Diagnosis not present

## 2021-08-31 DIAGNOSIS — Z0000 Encounter for general adult medical examination without abnormal findings: Secondary | ICD-10-CM

## 2021-08-31 NOTE — Progress Notes (Signed)
? ? Patient ID: Connie Rosales, female    DOB: 1975/07/27, 46 y.o.   MRN: [id number] ? ?This visit was conducted in person. ? ?BP 102/60   Pulse 68   Ht 5' 2.5" (1.588 m)   Wt 120 lb (54.4 kg)   SpO2 99%   BMI 21.60 kg/m?   ? ?CC:  ?Chief Complaint  ?Patient presents with  ? Annual Exam  ?  Last pap--2022  ? ? ?Subjective:  ? ?HPI: ?Connie Rosales is a 46 y.o. female presenting on 08/31/2021 for Annual Exam (Last pap--2022) ?Reviewed labs in detail with patient.  Questions answered. ? ?Lab Results  ?Component Value Date  ? CHOL 198 08/27/2021  ? HDL 82.90 08/27/2021  ? [place] 94 08/27/2021  ? LDLDIRECT 107.0 01/28/2012  ? TRIG 106.0 08/27/2021  ? CHOLHDL 2 08/27/2021  ? ?The 10-year ASCVD risk score (Arnett DK, et al., 2019) is: 0.3% ?  Values used to calculate the score: ?    Age: 45 years ?    Sex: Female ?    Is Non-Hispanic African American: No ?    Diabetic: No ?    Tobacco smoker: No ?    Systolic Blood Pressure: 841 mmHg ?    Is BP treated: No ?    HDL Cholesterol: 82.9 mg/dL ?    Total Cholesterol: 198 mg/dL ? ?Common migraine: doing well ? ? Iron deficiency anemia:  resolved Hg 12.5, iron panel ? ?Diet: heart healthy diet... vegetarian ?Exercise: yoga and weight training ? ?Wt Readings from Last 3 Encounters:  ?08/31/21 120 lb (54.4 kg)  ?07/25/20 120 lb 4 oz (54.5 kg)  ?02/18/17 119 lb (54 kg)  ? ?Body mass index is 21.6 kg/m?. ? ? ?   ?[facility] Office Visit from 08/31/2021 in [facility]  ?PHQ-2 Total Score 0  ? ?  ? ? ?Relevant past medical, surgical, family and social history reviewed and updated as indicated. Interim medical history since our last visit reviewed. ?Allergies and medications reviewed and updated. ?Outpatient Medications Prior to Visit  ?Medication Sig Dispense Refill  ? LO LOESTRIN FE 1 MG-10 MCG / 10 MCG tablet Take 1 tablet by mouth daily.  11  ? Multiple Vitamin (MULTIVITAMIN) tablet Take 1 tablet by mouth daily.    ? ?No facility-administered  medications prior to visit.  ?  ? ?Per HPI unless specifically indicated in ROS section below ?Review of Systems  ?Constitutional:  Negative for fatigue and fever.  ?HENT:  Negative for congestion.   ?Eyes:  Negative for pain.  ?Respiratory:  Negative for cough and shortness of breath.   ?Cardiovascular:  Negative for chest pain, palpitations and leg swelling.  ?Gastrointestinal:  Negative for abdominal pain.  ?Genitourinary:  Negative for dysuria and vaginal bleeding.  ?Musculoskeletal:  Negative for back pain.  ?Neurological:  Negative for syncope, light-headedness and headaches.  ?Psychiatric/Behavioral:  Negative for dysphoric mood.   ?Objective:  ?BP 102/60   Pulse 68   Ht 5' 2.5" (1.588 m)   Wt 120 lb (54.4 kg)   SpO2 99%   BMI 21.60 kg/m?   ?Wt Readings from Last 3 Encounters:  ?08/31/21 120 lb (54.4 kg)  ?07/25/20 120 lb 4 oz (54.5 kg)  ?02/18/17 119 lb (54 kg)  ?  ?  ?Physical Exam ?Vitals and nursing note reviewed.  ?Constitutional:   ?   General: She is not in acute distress. ?   Appearance: Normal appearance. She is well-developed. She  is not ill-appearing or toxic-appearing.  ?HENT:  ?   Head: Normocephalic.  ?   Right Ear: Hearing, tympanic membrane, ear canal and external ear normal.  ?   Left Ear: Hearing, tympanic membrane, ear canal and external ear normal.  ?   Nose: Nose normal.  ?Eyes:  ?   General: Lids are normal. Lids are everted, no foreign bodies appreciated.  ?   Conjunctiva/sclera: Conjunctivae normal.  ?   Pupils: Pupils are equal, round, and reactive to light.  ?Neck:  ?   Thyroid: No thyroid mass or thyromegaly.  ?   Vascular: No carotid bruit.  ?   Trachea: Trachea normal.  ?Cardiovascular:  ?   Rate and Rhythm: Normal rate and regular rhythm.  ?   Heart sounds: Normal heart sounds, S1 normal and S2 normal. No murmur heard. ?  No gallop.  ?Pulmonary:  ?   Effort: Pulmonary effort is normal. No respiratory distress.  ?   Breath sounds: Normal breath sounds. No wheezing, rhonchi or  rales.  ?Abdominal:  ?   General: Bowel sounds are normal. There is no distension or abdominal bruit.  ?   Palpations: Abdomen is soft. There is no fluid wave or mass.  ?   Tenderness: There is no abdominal tenderness. There is no guarding or rebound.  ?   Hernia: No hernia is present.  ?Musculoskeletal:  ?   Cervical back: Normal range of motion and neck supple.  ?Lymphadenopathy:  ?   Cervical: No cervical adenopathy.  ?Skin: ?   General: Skin is warm and dry.  ?   Findings: No rash.  ?Neurological:  ?   Mental Status: She is alert.  ?   Cranial Nerves: No cranial nerve deficit.  ?   Sensory: No sensory deficit.  ?Psychiatric:     ?   Mood and Affect: Mood is not anxious or depressed.     ?   Speech: Speech normal.     ?   Behavior: Behavior normal. Behavior is cooperative.     ?   Judgment: Judgment normal.  ? ?   ?Results for orders placed or performed in visit on 08/27/21  ?IBC + Ferritin  ?Result Value Ref Range  ? Iron 100 42 - 145 ug/dL  ? Transferrin 260.0 212.0 - 360.0 mg/dL  ? Saturation Ratios 27.5 20.0 - 50.0 %  ? Ferritin 40.2 10.0 - 291.0 ng/mL  ? TIBC 364.0 250.0 - 450.0 mcg/dL  ?CBC with Differential/Platelet  ?Result Value Ref Range  ? WBC 4.8 4.0 - 10.5 K/uL  ? RBC 3.88 3.87 - 5.11 Mil/uL  ? Hemoglobin 12.5 12.0 - 15.0 g/dL  ? HCT 36.9 36.0 - 46.0 %  ? MCV 94.9 78.0 - 100.0 fl  ? MCHC 34.0 30.0 - 36.0 g/dL  ? RDW 12.3 11.5 - 15.5 %  ? Platelets 248.0 150.0 - 400.0 K/uL  ? Neutrophils Relative % 43.0 43.0 - 77.0 %  ? Lymphocytes Relative 43.3 12.0 - 46.0 %  ? Monocytes Relative 5.1 3.0 - 12.0 %  ? Eosinophils Relative 7.5 (H) 0.0 - 5.0 %  ? Basophils Relative 1.1 0.0 - 3.0 %  ? Neutro Abs 2.1 1.4 - 7.7 K/uL  ? Lymphs Abs 2.1 0.7 - 4.0 K/uL  ? Monocytes Absolute 0.2 0.1 - 1.0 K/uL  ? Eosinophils Absolute 0.4 0.0 - 0.7 K/uL  ? Basophils Absolute 0.1 0.0 - 0.1 K/uL  ?Comprehensive metabolic panel  ?Result Value Ref Range  ?  Sodium 138 135 - 145 mEq/L  ? Potassium 3.9 3.5 - 5.1 mEq/L  ? Chloride 105 96  - 112 mEq/L  ? CO2 27 19 - 32 mEq/L  ? Glucose, Bld 86 70 - 99 mg/dL  ? BUN 9 6 - 23 mg/dL  ? Creatinine, Ser 0.72 0.40 - 1.20 mg/dL  ? Total Bilirubin 0.4 0.2 - 1.2 mg/dL  ? Alkaline Phosphatase 73 39 - 117 U/L  ? AST 18 0 - 37 U/L  ? ALT 12 0 - 35 U/L  ? Total Protein 7.0 6.0 - 8.3 g/dL  ? Albumin 4.2 3.5 - 5.2 g/dL  ? GFR 100.40 >60.00 mL/min  ? Calcium 8.9 8.4 - 10.5 mg/dL  ?Lipid panel  ?Result Value Ref Range  ? Cholesterol 198 0 - 200 mg/dL  ? Triglycerides 106.0 0.0 - 149.0 mg/dL  ? HDL 82.90 >39.00 mg/dL  ? VLDL 21.2 0.0 - 40.0 mg/dL  ? LDL Cholesterol 94 0 - 99 mg/dL  ? Total CHOL/HDL Ratio 2   ? NonHDL 115.30   ? ? ?This visit occurred during the SARS-CoV-2 public health emergency.  Safety protocols were in place, including screening questions prior to the visit, additional usage of staff PPE, and extensive cleaning of exam room while observing appropriate contact time as indicated for disinfecting solutions.  ? ?COVID 19 screen:  No recent travel or known exposure to [place] ?The patient denies respiratory symptoms of COVID 19 at this time. ?The importance of social distancing was discussed today.  ? ?Assessment and Plan ?The patient's preventative maintenance and recommended screening tests for an annual wellness exam were reviewed in full today. ?Brought up to date unless services declined. ? ?Counselled on the importance of diet, exercise, and its role in overall health and mortality. ?The patient's FH and SH was reviewed, including their home life, tobacco status, and drug and alcohol status.  ? ? Vaccines: Uptodate Tdap,  flu at employee [employer] ?PAP/DVE: Sees Dr. Marvel Plan, [place], last pap 2022 ? Mammogram : per GYN ?No early family history of breast cancer or colon cancer. ?Colon cancer screening:  IFOB last year negative, plan colonoscopy. ?Nonsmoker. ?HIV: refused ?  ?Problem List Items Addressed This Visit   ?None ?Visit Diagnoses   ? ? Routine general medical examination at a health care facility     -  Primary  ? Colon cancer screening      ? Relevant Orders  ? Ambulatory referral to Gastroenterology  ? ?  ? ? ? ?Eliezer Lofts, MD  ? ?

## 2021-08-31 NOTE — Patient Instructions (Signed)
Keep working on healthy lifestyle. ?  ?Can call to set up appt with GI for colon cancer screening. ?[facility]  [phone] ?[facility]  [phone] ?[facility]  [phone] ? ? ? ?

## 2021-09-13 ENCOUNTER — Encounter: Payer: Self-pay | Admitting: Family Medicine

## 2021-09-24 ENCOUNTER — Telehealth: Payer: Self-pay

## 2021-09-24 NOTE — Telephone Encounter (Signed)
CALLED PATIENT NO ANSWER LEFT VOICEMAIL FOR A CALL BACK ? ?

## 2021-09-25 ENCOUNTER — Telehealth: Payer: Self-pay

## 2021-09-25 NOTE — Telephone Encounter (Signed)
CALLED PATIENT NO ANSWER LEFT VOICEMAIL FOR A CALL BACK °Letter sent °

## 2021-09-25 NOTE — Telephone Encounter (Signed)
CALLED PATIENT NO ANSWER LEFT VOICEMAIL FOR A CALL BACK ? ?

## 2021-09-26 ENCOUNTER — Other Ambulatory Visit: Payer: Self-pay

## 2021-09-26 DIAGNOSIS — Z1211 Encounter for screening for malignant neoplasm of colon: Secondary | ICD-10-CM

## 2021-09-26 MED ORDER — NA SULFATE-K SULFATE-MG SULF 17.5-3.13-1.6 GM/177ML PO SOLN: 1.0000 | Freq: Once | ORAL | 0 refills | Status: AC

## 2021-09-26 NOTE — Progress Notes (Signed)
Gastroenterology Pre-Procedure Review ? ?Request Date: 11/26/2021 ?Requesting Physician: Dr. Marius Ditch ? ? ?PATIENT REVIEW QUESTIONS: The patient responded to the following health history questions as indicated:   ? ?1. Are you having any GI issues? no ?2. Do you have a personal history of Polyps? no ?3. Do you have a family history of Colon Cancer or Polyps? no ?4. Diabetes Mellitus? no ?5. Joint replacements in the past 12 months?no ?6. Major health problems in the past 3 months?no ?7. Any artificial heart valves, MVP, or defibrillator?no ?   ?MEDICATIONS & ALLERGIES:    ?Patient reports the following regarding taking any anticoagulation/antiplatelet therapy:   ?Plavix, Coumadin, Eliquis, Xarelto, Lovenox, Pradaxa, Brilinta, or Effient? no ?Aspirin? no ? ?Patient confirms/reports the following medications:  ?Current Outpatient Medications  ?Medication Sig Dispense Refill  ? LO LOESTRIN FE 1 MG-10 MCG / 10 MCG tablet Take 1 tablet by mouth daily.  11  ? Multiple Vitamin (MULTIVITAMIN) tablet Take 1 tablet by mouth daily.    ? ?No current facility-administered medications for this visit.  ? ? ?Patient confirms/reports the following allergies:  ?No Known Allergies ? ?No orders of the defined types were placed in this encounter. ? ? ?AUTHORIZATION INFORMATION ?Primary Insurance: ?1D#: ?Group #: ? ?Secondary Insurance: ?1D#: ?Group #: ? ?SCHEDULE INFORMATION: ?Date: 11/26/2021 ?Time: ?Location: [facility] ?

## 2021-11-13 IMAGING — MG MM BREAST LOCALIZATION CLIP
4 series · 4 of 12 positions shown · non-contrast
Comparison: Previous exam(s).

CLINICAL DATA: Confirmation of clip placement after stereotactic
tomosynthesis core needle biopsy of a focal asymmetry involving the
LOWER QUADRANT of the LEFT breast at POSTERIOR depth.

EXAM:
2D AND TOMOSYNTHESIS DIAGNOSTIC LEFT MAMMOGRAM POST STEREOTACTIC
BIOPSY

[L CC synth-2D]
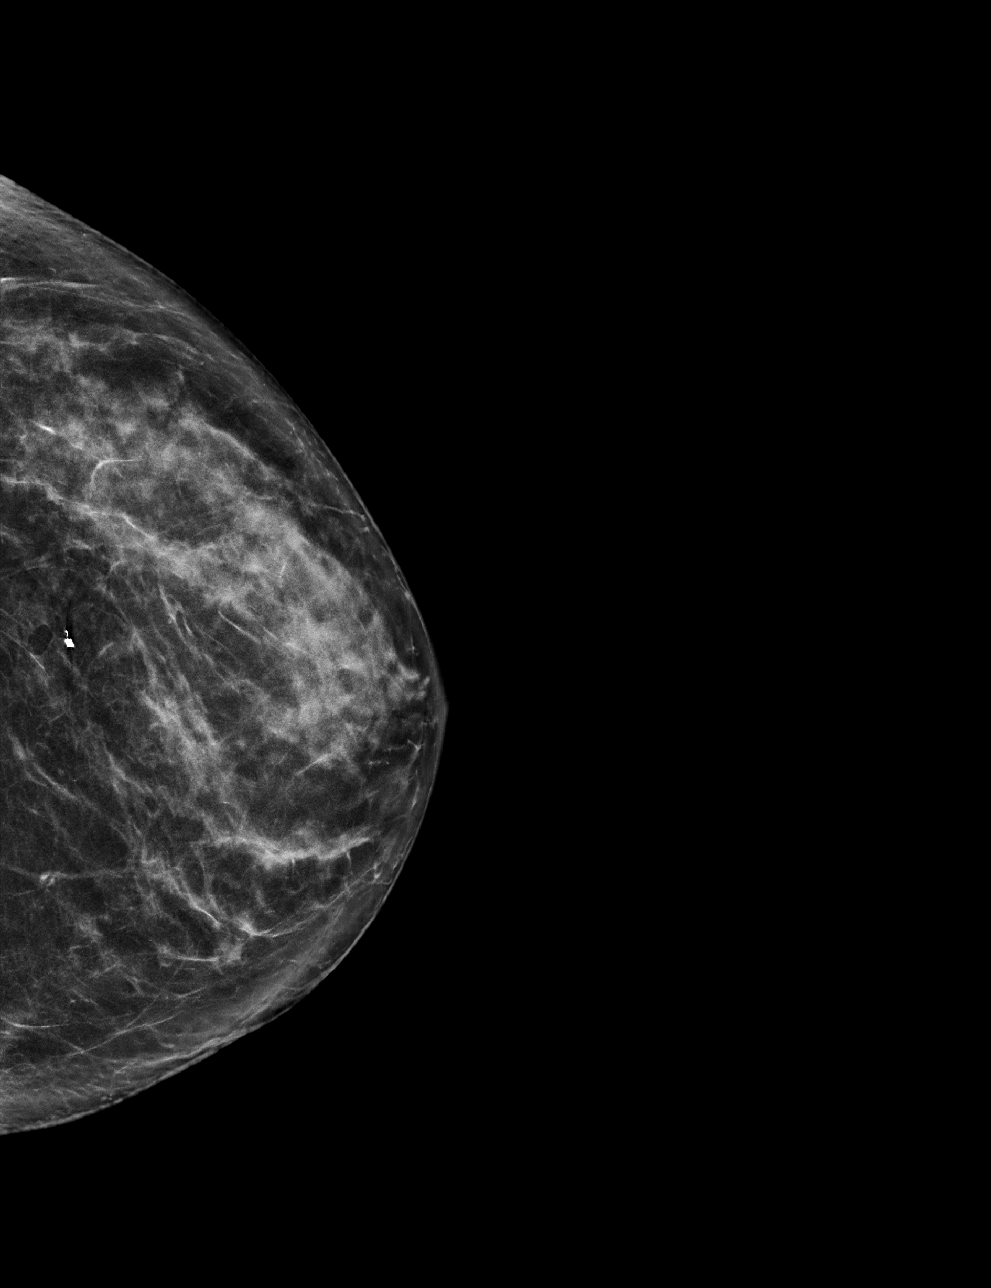

[L LM synth-2D]
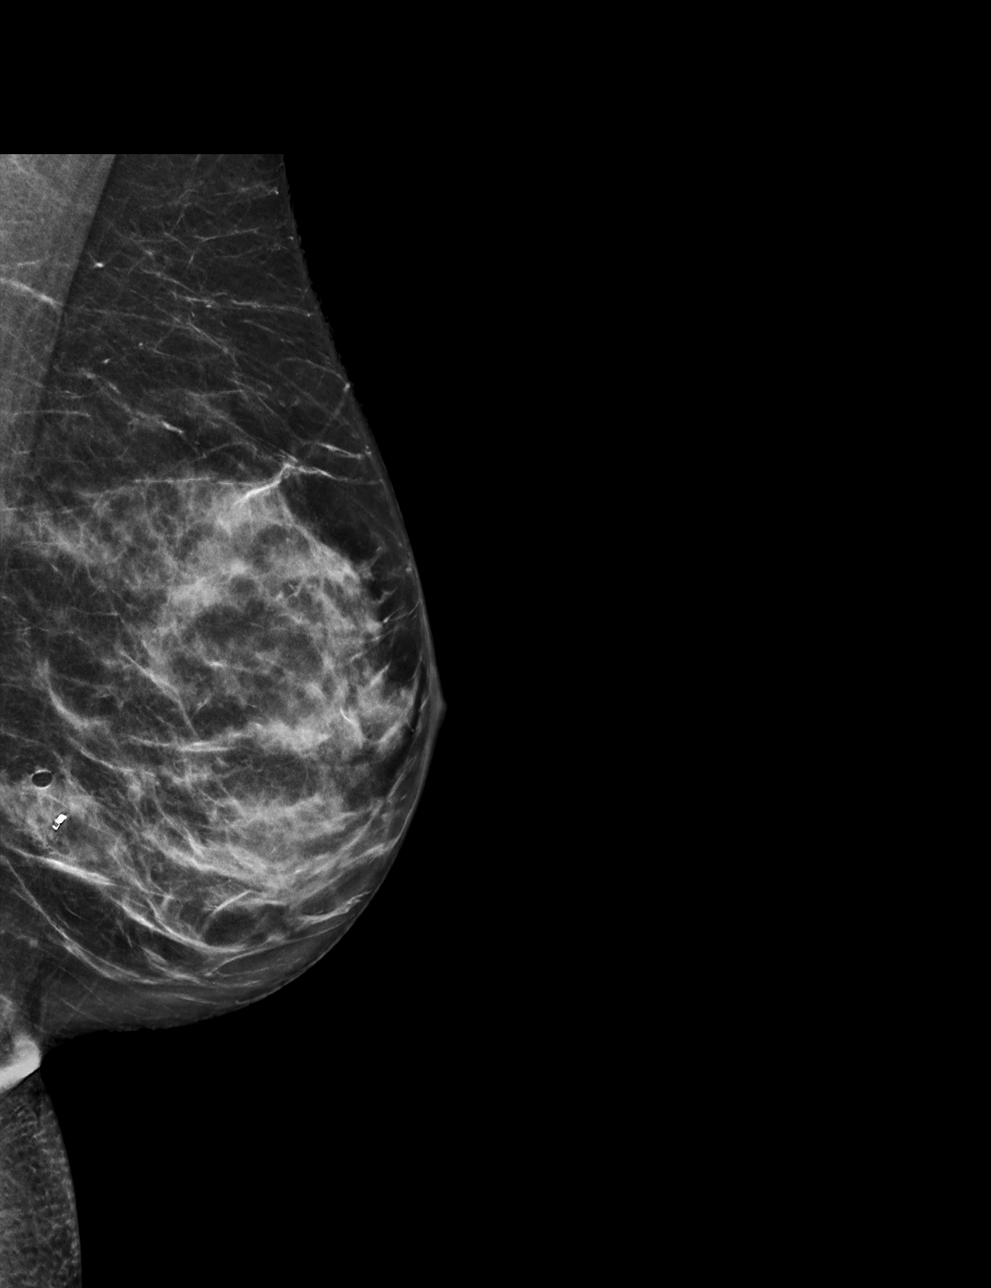

[L CC tomo · tomo slice 29/58.0]
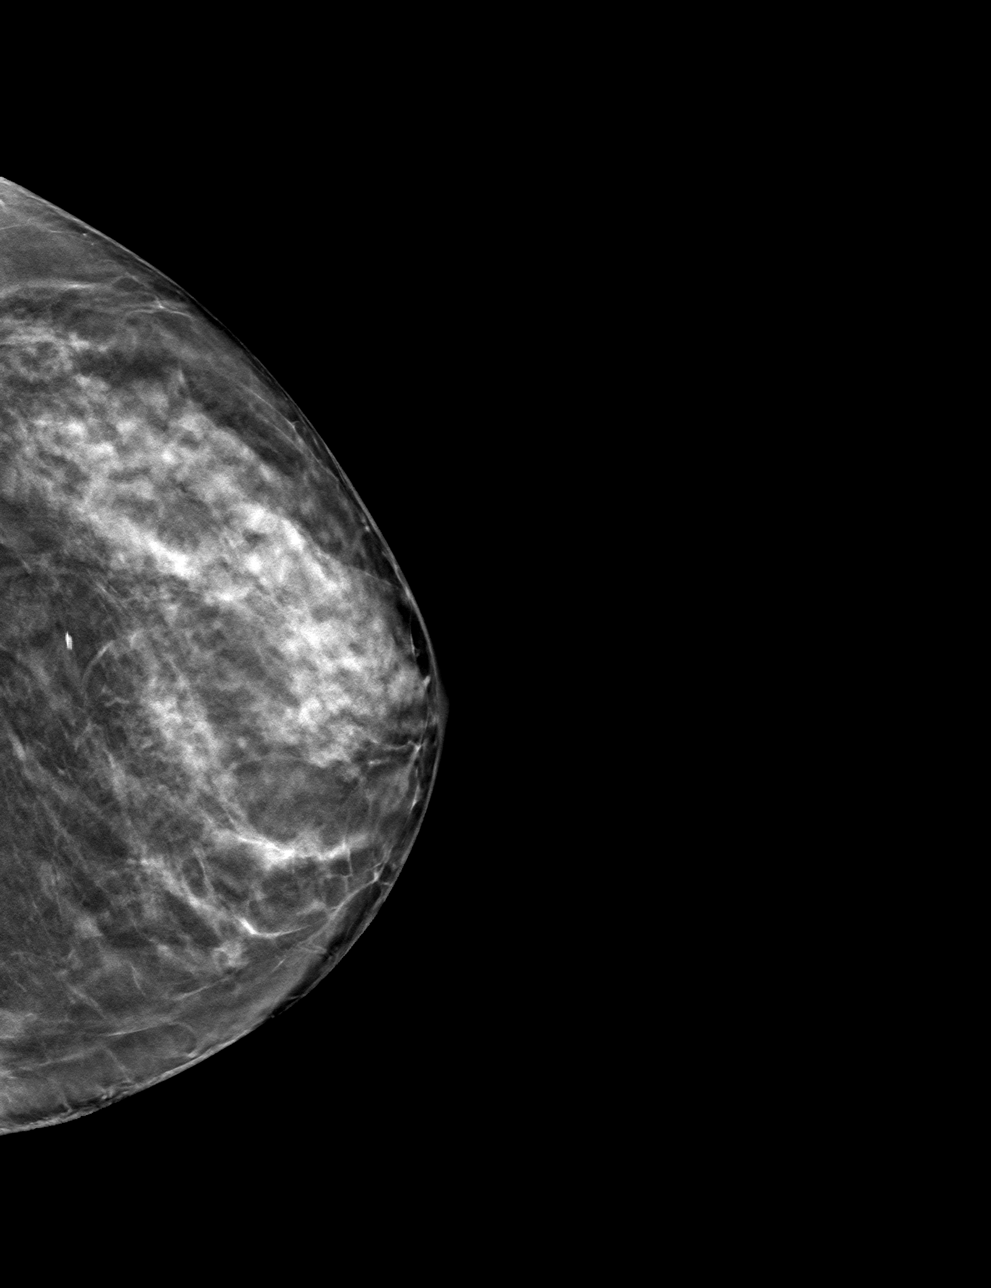

[L LM tomo · tomo slice 31/60.0]
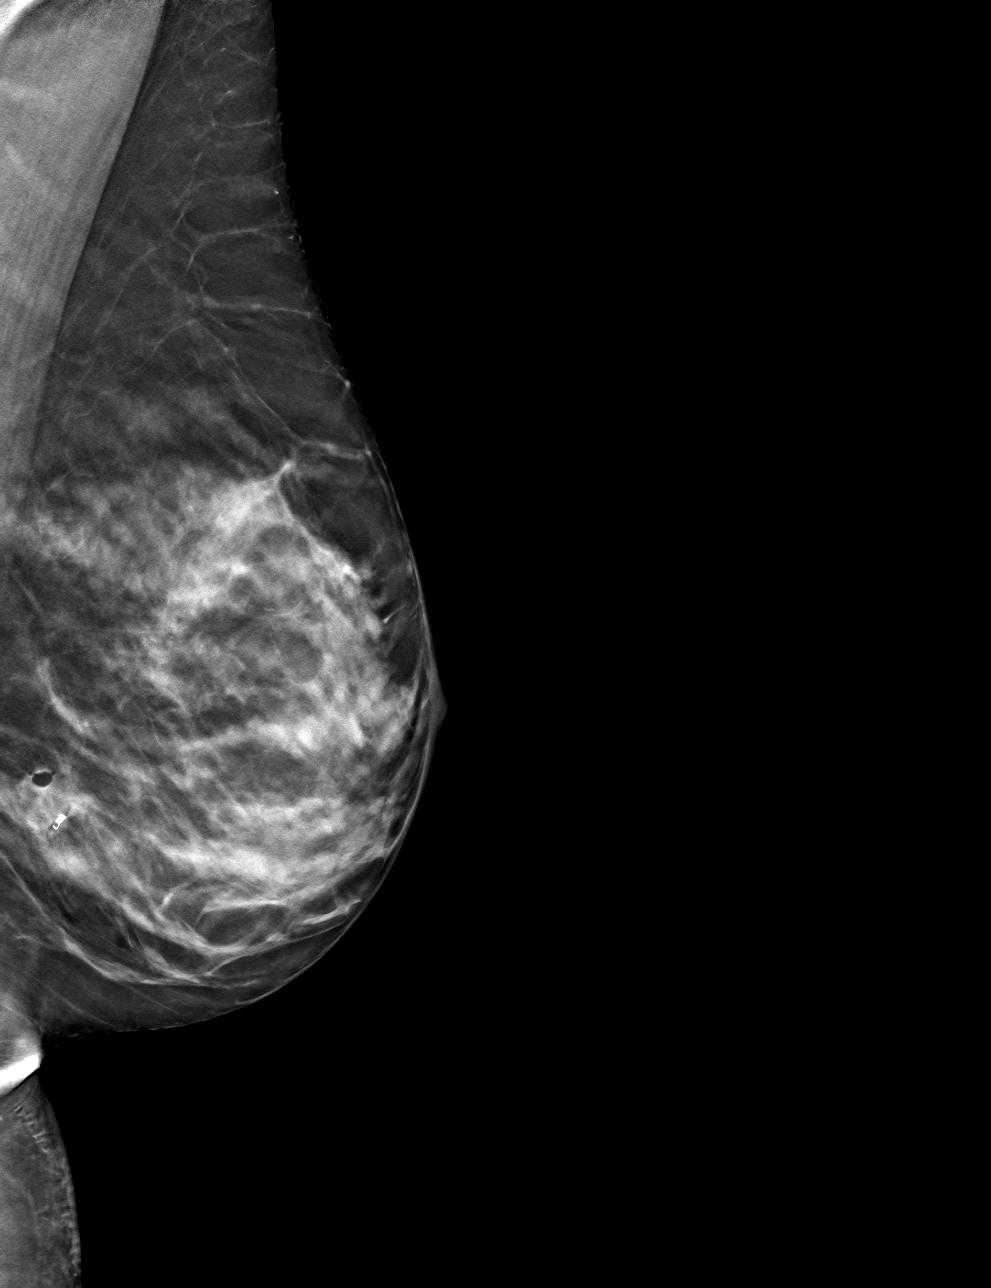

[4 of 12 positions shown; findings below may reference images not displayed]

FINDINGS: Tomosynthesis and synthesized full field CC and mediolateral images
were obtained following stereotactic tomosynthesis guided biopsy of
is a focal asymmetry involving the LOWER OUTER QUADRANT LEFT breast
at POSTERIOR depth. The coil shaped tissue marking clip migrated
approximately 1.5 cm MEDIAL to the biopsied focal asymmetry on the
CC image; it directly overlies the focal asymmetry the mediolateral
image.

Expected post biopsy changes are present without evidence of
hematoma.
IMPRESSION: Approximate 1.5 cm MEDIAL migration of the coil shaped tissue marker
clip relative to the biopsied focal asymmetry LOWER OUTER QUADRANT
of the LEFT breast.

Final Assessment: Post Procedure Mammograms for Marker Placement

## 2021-11-26 ENCOUNTER — Ambulatory Visit: Payer: BC Managed Care – PPO | Admitting: Anesthesiology

## 2021-11-26 ENCOUNTER — Encounter
Admission: RE | Disposition: A | Discharge status: Home / Self Care | Payer: Self-pay | Source: Ambulatory Visit | Attending: Gastroenterology

## 2021-11-26 ENCOUNTER — Ambulatory Visit
Admission: RE | Admit: 2021-11-26 | Discharge: 2021-11-26 | Disposition: A | Discharge status: Home / Self Care | Payer: BC Managed Care – PPO | Source: Ambulatory Visit | Attending: Gastroenterology | Admitting: Gastroenterology

## 2021-11-26 ENCOUNTER — Other Ambulatory Visit: Payer: Self-pay

## 2021-11-26 ENCOUNTER — Encounter: Payer: Self-pay | Admitting: Gastroenterology

## 2021-11-26 DIAGNOSIS — Z1211 Encounter for screening for malignant neoplasm of colon: Secondary | ICD-10-CM | POA: Diagnosis not present

## 2021-11-26 HISTORY — PX: COLONOSCOPY WITH PROPOFOL: SHX5780

## 2021-11-26 LAB — POCT PREGNANCY, URINE: Preg Test, Ur: NEGATIVE

## 2021-11-26 SURGERY — COLONOSCOPY WITH PROPOFOL
Anesthesia: General

## 2021-11-26 MED ORDER — PROPOFOL 1000 MG/100ML IV EMUL
INTRAVENOUS | Status: AC
  Filled 2021-11-26: qty 100

## 2021-11-26 MED ORDER — LIDOCAINE HCL (CARDIAC) PF 100 MG/5ML IV SOSY
PREFILLED_SYRINGE | INTRAVENOUS | Status: DC | PRN
  Administered 2021-11-26: 60 mg via INTRAVENOUS

## 2021-11-26 MED ORDER — PROPOFOL 10 MG/ML IV BOLUS
INTRAVENOUS | Status: DC | PRN
  Administered 2021-11-26: 30 mg via INTRAVENOUS
  Administered 2021-11-26: 100 mg via INTRAVENOUS

## 2021-11-26 MED ORDER — PROPOFOL 500 MG/50ML IV EMUL
INTRAVENOUS | Status: DC | PRN
  Administered 2021-11-26: 150 ug/kg/min via INTRAVENOUS

## 2021-11-26 MED ORDER — SODIUM CHLORIDE 0.9 % IV SOLN: INTRAVENOUS | Status: DC

## 2021-11-26 MED ORDER — LIDOCAINE HCL (PF) 2 % IJ SOLN
INTRAMUSCULAR | Status: AC
  Filled 2021-11-26: qty 5

## 2021-11-26 NOTE — H&P (Signed)
Cephas Darby, MD [address]  [place]  [place], [place] [address]  Main: [phone]  Fax: [phone] Pager: [phone]  Primary Care Physician:  Jinny Sanders, MD Primary Gastroenterologist:  Dr. Cephas Darby  Pre-Procedure History & Physical: HPI:  Connie Rosales is a 46 y.o. female is here for an colonoscopy.   Past Medical History:  Diagnosis Date   History of dysplastic nevus 2013   right upper back spinal, left ant prox thigh near groin   History of dysplastic nevus 03/06/2020   L ant thigh, mild atypia   History of dysplastic nevus 03/06/2020   L post shoulder, moderate atypia    Past Surgical History:  Procedure Laterality Date   arhtroscopic meniscal repair  2003   ARTHROSCOPIC REPAIR ACL  2002   right     Prior to Admission medications   Medication Sig Start Date End Date Taking? Authorizing Provider  Multiple Vitamin (MULTIVITAMIN) tablet Take 1 tablet by mouth daily.   Yes [provider]  LO LOESTRIN FE 1 MG-10 MCG / 10 MCG tablet Take 1 tablet by mouth daily. 12/20/14   [provider]    Allergies as of 09/26/2021   (No Known Allergies)    Family History  Problem Relation Age of Onset   Macular degeneration Mother    Glaucoma Mother    Osteoporosis Mother 58   Cancer Father        prostate cancer   Hyperlipidemia Father    Melanoma Brother 24   Alcohol abuse Maternal Aunt    Osteoporosis Maternal Grandmother    Heart disease Maternal Grandfather    Heart disease Paternal Grandfather        MI    Social History   Socioeconomic History   Marital status: Married    Spouse name: Not on file   Number of children: Not on file   Years of education: Not on file   Highest education level: Not on file  Occupational History   Not on file  Tobacco Use   Smoking status: Never    Passive exposure: Never   Smokeless tobacco: Never  Vaping Use   Vaping Use: Never used  Substance and Sexual Activity    Alcohol use: Yes    Alcohol/week: 0.0 standard drinks of alcohol    Comment: rarely   Drug use: No   Sexual activity: Yes    Birth control/protection: Pill  Other Topics Concern   Not on file  Social History Narrative   Not on file   Social Determinants of Health   Financial Resource Strain: Not on file  Food Insecurity: Not on file  Transportation Needs: Not on file  Physical Activity: Not on file  Stress: Not on file  Social Connections: Not on file  Intimate Partner Violence: Not on file    Review of Systems: See HPI, otherwise negative ROS  Physical Exam: BP 115/84   Pulse 72   Temp (!) 96.8 F (36 C) (Temporal)   Resp 20   Ht '5\' 3"'$  (1.6 m)   Wt 53.5 kg   SpO2 100%   BMI 20.90 kg/m  General:   Alert,  pleasant and cooperative in NAD Head:  Normocephalic and atraumatic. Neck:  Supple; no masses or thyromegaly. Lungs:  Clear throughout to auscultation.    Heart:  Regular rate and rhythm. Abdomen:  Soft, nontender and nondistended. Normal bowel sounds, without guarding, and without rebound.   Neurologic:  Alert and  oriented x4;  grossly normal neurologically.  Impression/Plan: Connie Rosales is here for an colonoscopy to be performed for colon cancer screening  Risks, benefits, limitations, and alternatives regarding  colonoscopy have been reviewed with the patient.  Questions have been answered.  All parties agreeable.   Sherri Sear, MD  11/26/2021, 10:37 AM

## 2021-11-26 NOTE — Transfer of Care (Signed)
Immediate Anesthesia Transfer of Care Note  Patient: Connie Rosales  Procedure(s) Performed: COLONOSCOPY WITH PROPOFOL  Patient Location: PACU and Endoscopy Unit  Anesthesia Type:General  Level of Consciousness: awake  Airway & Oxygen Therapy: Patient Spontanous Breathing  Post-op Assessment: Report given to RN  Post vital signs: stable  Last Vitals:  Vitals Value Taken Time  BP 95/52 11/26/21 1114  Temp 36.1 C 11/26/21 1114  Pulse 69 11/26/21 1114  Resp 17 11/26/21 1114  SpO2 100 % 11/26/21 1114    Last Pain:  Vitals:   11/26/21 1114  TempSrc: Temporal  PainSc: 0-No pain         Complications: No notable events documented.

## 2021-11-26 NOTE — Anesthesia Preprocedure Evaluation (Signed)
Anesthesia Evaluation  Patient identified by MRN, date of birth, ID band Patient awake    Reviewed: Allergy & Precautions, NPO status , Patient's Chart, lab work & pertinent test results  History of Anesthesia Complications Negative for: history of anesthetic complications  Airway Mallampati: III  TM Distance: <3 FB Neck ROM: full    Dental  (+) Chipped   Pulmonary neg pulmonary ROS, neg shortness of breath,    Pulmonary exam normal        Cardiovascular Exercise Tolerance: Good (-) anginanegative cardio ROS Normal cardiovascular exam     Neuro/Psych  Headaches, negative psych ROS   GI/Hepatic negative GI ROS, Neg liver ROS, neg GERD  ,  Endo/Other  negative endocrine ROS  Renal/GU negative Renal ROS  negative genitourinary   Musculoskeletal   Abdominal   Peds  Hematology negative hematology ROS (+)   Anesthesia Other Findings Past Medical History: 2013: History of dysplastic nevus     Comment:  right upper back spinal, left ant prox thigh near groin 03/06/2020: History of dysplastic nevus     Comment:  L ant thigh, mild atypia 03/06/2020: History of dysplastic nevus     Comment:  L post shoulder, moderate atypia  Past Surgical History: 2003: arhtroscopic meniscal repair 2002: ARTHROSCOPIC REPAIR ACL     Comment:  right   BMI    Body Mass Index: 20.90 kg/m      Reproductive/Obstetrics negative OB ROS                             Anesthesia Physical Anesthesia Plan  ASA: 2  Anesthesia Plan: General   Post-op Pain Management:    Induction: Intravenous  PONV Risk Score and Plan: Propofol infusion and TIVA  Airway Management Planned: Natural Airway and Nasal Cannula  Additional Equipment:   Intra-op Plan:   Post-operative Plan:   Informed Consent: I have reviewed the patients History and Physical, chart, labs and discussed the procedure including the risks,  benefits and alternatives for the proposed anesthesia with the patient or authorized representative who has indicated his/her understanding and acceptance.     Dental Advisory Given  Plan Discussed with: Anesthesiologist, CRNA and Surgeon  Anesthesia Plan Comments: (Patient consented for risks of anesthesia including but not limited to:  - adverse reactions to medications - risk of airway placement if required - damage to eyes, teeth, lips or other oral mucosa - nerve damage due to positioning  - sore throat or hoarseness - Damage to heart, brain, nerves, lungs, other parts of body or loss of life  Patient voiced understanding.)        Anesthesia Quick Evaluation

## 2021-11-26 NOTE — Op Note (Signed)
[facility]enter[facility] Patient Name: Connie Rosales Procedure Date: 11/26/2021 10:52 AM MRN: [id number] Account #: [id number] Date of Birth: [birth year] Admit Type: Outpatient Age: 46 Room: [facility] ENDO ROOM 4 Gender: Female Note Status: Finalized Instrument Name: Jasper Riling [id number] Procedure:             Colonoscopy Indications:           Screening for colorectal malignant neoplasm, This is                         the patient's first colonoscopy Providers:             Lin Landsman MD, MD Referring MD:          Jinny Sanders MD, MD (Referring MD) Medicines:             General Anesthesia Complications:         No immediate complications. Estimated blood loss: None. Procedure:             Pre-Anesthesia Assessment:                        - Prior to the procedure, a History and Physical was                         performed, and patient medications and allergies were                         reviewed. The patient is competent. The risks and                         benefits of the procedure and the sedation options and                         risks were discussed with the patient. All questions                         were answered and informed consent was obtained.                         Patient identification and proposed procedure were                         verified by the physician, the nurse, the                         anesthesiologist, the anesthetist and the technician                         in the pre-procedure area in the procedure room in the                         endoscopy suite. Mental Status Examination: alert and                         oriented. Airway Examination: normal oropharyngeal                         airway and neck mobility. Respiratory Examination:  clear to auscultation. CV Examination: normal.                         Prophylactic Antibiotics: The patient does not require                          prophylactic antibiotics. Prior Anticoagulants: The                         patient has taken no previous anticoagulant or                         antiplatelet agents. ASA Grade Assessment: II - A                         patient with mild systemic disease. After reviewing                         the risks and benefits, the patient was deemed in                         satisfactory condition to undergo the procedure. The                         anesthesia plan was to use general anesthesia.                         Immediately prior to administration of medications,                         the patient was re-assessed for adequacy to receive                         sedatives. The heart rate, respiratory rate, oxygen                         saturations, blood pressure, adequacy of pulmonary                         ventilation, and response to care were monitored                         throughout the procedure. The physical status of the                         patient was re-assessed after the procedure.                        After obtaining informed consent, the colonoscope was                         passed under direct vision. Throughout the procedure,                         the patient's blood pressure, pulse, and oxygen                         saturations were monitored continuously. The  Colonoscope was introduced through the anus and                         advanced to the the terminal ileum, with                         identification of the appendiceal orifice and IC                         valve. The colonoscopy was performed without                         difficulty. The patient tolerated the procedure well.                         The quality of the bowel preparation was evaluated                         using the BBPS [facility] Bowel Preparation Scale) with                         scores of: Right Colon = 3, Transverse Colon = 3 and                          Left Colon = 3 (entire mucosa seen well with no                         residual staining, small fragments of stool or opaque                         liquid). The total BBPS score equals 9. Findings:      The perianal and digital rectal examinations were normal. Pertinent       negatives include normal sphincter tone and no palpable rectal lesions.      The terminal ileum appeared normal.      The entire examined colon appeared normal.      The retroflexed view of the distal rectum and anal verge was normal and       showed no anal or rectal abnormalities. Impression:            - The examined portion of the ileum was normal.                        - The entire examined colon is normal.                        - The distal rectum and anal verge are normal on                         retroflexion view.                        - No specimens collected. Recommendation:        - Discharge patient to home (with escort).                        - Resume previous diet today.                        -  Continue present medications.                        - Repeat colonoscopy in 10 years for screening                         purposes. Procedure Code(s):     --- Professional ---                        D3143, Colorectal cancer screening; colonoscopy on                         individual not meeting criteria for high risk Diagnosis Code(s):     --- Professional ---                        Z12.11, Encounter for screening for malignant neoplasm                         of colon CPT copyright 2019 American Medical Association. All rights reserved. The codes documented in this report are preliminary and upon coder review may  be revised to meet current compliance requirements. Dr. Ulyess Mort Lin Landsman MD, MD 11/26/2021 11:10:25 AM This report has been signed electronically. Number of Addenda: 0 Note Initiated On: 11/26/2021 10:52 AM Scope Withdrawal Time: 0 hours 5 minutes 59 seconds  Total  Procedure Duration: 0 hours 10 minutes 33 seconds  Estimated Blood Loss:  Estimated blood loss: none.      [facility]

## 2021-11-26 NOTE — Anesthesia Postprocedure Evaluation (Signed)
Anesthesia Post Note  Patient: Connie Rosales  Procedure(s) Performed: COLONOSCOPY WITH PROPOFOL  Patient location during evaluation: Endoscopy Anesthesia Type: General Level of consciousness: awake and alert Pain management: pain level controlled Vital Signs Assessment: post-procedure vital signs reviewed and stable Respiratory status: spontaneous breathing, nonlabored ventilation, respiratory function stable and patient connected to nasal cannula oxygen Cardiovascular status: blood pressure returned to baseline and stable Postop Assessment: no apparent nausea or vomiting Anesthetic complications: no   No notable events documented.   Last Vitals:  Vitals:   11/26/21 1124 11/26/21 1134  BP: 97/77 136/78  Pulse: 69 66  Resp: 12 20  Temp:    SpO2:      Last Pain:  Vitals:   11/26/21 1134  TempSrc:   PainSc: 0-No pain                 Precious Haws Kyzen Horn

## 2021-11-27 ENCOUNTER — Encounter: Payer: Self-pay | Admitting: Family Medicine

## 2021-11-27 ENCOUNTER — Encounter: Payer: Self-pay | Admitting: Gastroenterology

## 2022-01-03 DIAGNOSIS — J3489 Other specified disorders of nose and nasal sinuses: Secondary | ICD-10-CM | POA: Diagnosis not present

## 2022-01-03 DIAGNOSIS — M950 Acquired deformity of nose: Secondary | ICD-10-CM | POA: Diagnosis not present

## 2022-01-19 ENCOUNTER — Encounter: Payer: Self-pay | Admitting: Family Medicine

## 2022-01-22 NOTE — Telephone Encounter (Signed)
FYI to Dr. [name]. 

## 2022-02-12 DIAGNOSIS — Z1231 Encounter for screening mammogram for malignant neoplasm of breast: Secondary | ICD-10-CM | POA: Diagnosis not present

## 2022-02-12 DIAGNOSIS — Z01419 Encounter for gynecological examination (general) (routine) without abnormal findings: Secondary | ICD-10-CM | POA: Diagnosis not present

## 2022-02-15 ENCOUNTER — Other Ambulatory Visit: Payer: Self-pay | Admitting: Obstetrics and Gynecology

## 2022-02-15 DIAGNOSIS — N6489 Other specified disorders of breast: Secondary | ICD-10-CM

## 2022-03-01 ENCOUNTER — Other Ambulatory Visit: Payer: Self-pay | Admitting: Obstetrics and Gynecology

## 2022-03-01 DIAGNOSIS — R928 Other abnormal and inconclusive findings on diagnostic imaging of breast: Secondary | ICD-10-CM

## 2022-03-06 ENCOUNTER — Ambulatory Visit
Admission: RE | Admit: 2022-03-06 | Discharge: 2022-03-06 | Disposition: A | Discharge status: Home / Self Care | Payer: BC Managed Care – PPO | Source: Ambulatory Visit | Attending: Obstetrics and Gynecology | Admitting: Obstetrics and Gynecology

## 2022-03-06 ENCOUNTER — Other Ambulatory Visit: Payer: Self-pay | Admitting: Obstetrics and Gynecology

## 2022-03-06 DIAGNOSIS — N6310 Unspecified lump in the right breast, unspecified quadrant: Secondary | ICD-10-CM

## 2022-03-06 DIAGNOSIS — R928 Other abnormal and inconclusive findings on diagnostic imaging of breast: Secondary | ICD-10-CM

## 2022-03-06 DIAGNOSIS — R92331 Mammographic heterogeneous density, right breast: Secondary | ICD-10-CM | POA: Diagnosis not present

## 2022-03-06 DIAGNOSIS — N6489 Other specified disorders of breast: Secondary | ICD-10-CM | POA: Diagnosis not present

## 2022-03-11 ENCOUNTER — Ambulatory Visit: Payer: BC Managed Care – PPO | Admitting: Dermatology

## 2022-03-11 DIAGNOSIS — D2262 Melanocytic nevi of left upper limb, including shoulder: Secondary | ICD-10-CM

## 2022-03-11 DIAGNOSIS — D229 Melanocytic nevi, unspecified: Secondary | ICD-10-CM

## 2022-03-11 DIAGNOSIS — D2272 Melanocytic nevi of left lower limb, including hip: Secondary | ICD-10-CM | POA: Diagnosis not present

## 2022-03-11 DIAGNOSIS — D225 Melanocytic nevi of trunk: Secondary | ICD-10-CM | POA: Diagnosis not present

## 2022-03-11 DIAGNOSIS — Z1283 Encounter for screening for malignant neoplasm of skin: Secondary | ICD-10-CM

## 2022-03-11 DIAGNOSIS — L821 Other seborrheic keratosis: Secondary | ICD-10-CM

## 2022-03-11 DIAGNOSIS — L578 Other skin changes due to chronic exposure to nonionizing radiation: Secondary | ICD-10-CM

## 2022-03-11 DIAGNOSIS — Q825 Congenital non-neoplastic nevus: Secondary | ICD-10-CM

## 2022-03-11 DIAGNOSIS — L814 Other melanin hyperpigmentation: Secondary | ICD-10-CM

## 2022-03-11 NOTE — Patient Instructions (Signed)
     Melanoma ABCDEs  Melanoma is the most dangerous type of skin cancer, and is the leading cause of death from skin disease.  You are more likely to develop melanoma if you: Have light-colored skin, light-colored eyes, or red or blond hair Spend a lot of time in the sun Tan regularly, either outdoors or in a tanning bed Have had blistering sunburns, especially during childhood Have a close family member who has had a melanoma Have atypical moles or large birthmarks  Early detection of melanoma is key since treatment is typically straightforward and cure rates are extremely high if we catch it early.   The first sign of melanoma is often a change in a mole or a new dark spot.  The ABCDE system is a way of remembering the signs of melanoma.  A for asymmetry:  The two halves do not match. B for border:  The edges of the growth are irregular. C for color:  A mixture of colors are present instead of an even brown color. D for diameter:  Melanomas are usually (but not always) greater than 6mm - the size of a pencil eraser. E for evolution:  The spot keeps changing in size, shape, and color.  Please check your skin once per month between visits. You can use a small mirror in front and a large mirror behind you to keep an eye on the back side or your body.   If you see any new or changing lesions before your next follow-up, please call to schedule a visit.  Please continue daily skin protection including broad spectrum sunscreen SPF 30+ to sun-exposed areas, reapplying every 2 hours as needed when you're outdoors.   Staying in the shade or wearing long sleeves, sun glasses (UVA+UVB protection) and wide brim hats (4-inch brim around the entire circumference of the hat) are also recommended for sun protection.    Due to recent changes in healthcare laws, you may see results of your pathology and/or laboratory studies on MyChart before the doctors have had a chance to review them. We  understand that in some cases there may be results that are confusing or concerning to you. Please understand that not all results are received at the same time and often the doctors may need to interpret multiple results in order to provide you with the best plan of care or course of treatment. Therefore, we ask that you please give us 2 business days to thoroughly review all your results before contacting the office for clarification. Should we see a critical lab result, you will be contacted sooner.   If You Need Anything After Your Visit  If you have any questions or concerns for your doctor, please call our main line at [phone] and press option 4 to reach your doctor's medical assistant. If no one answers, please leave a voicemail as directed and we will return your call as soon as possible. Messages left after 4 pm will be answered the following business day.   You may also send us a message via MyChart. We typically respond to MyChart messages within 1-2 business days.  For prescription refills, please ask your pharmacy to contact our office. Our fax number is [phone].  If you have an urgent issue when the clinic is closed that cannot wait until the next business day, you can page your doctor at the number below.    Please note that while we do our best to be available for urgent issues   outside of office hours, we are not available 24/7.   If you have an urgent issue and are unable to reach us, you may choose to seek medical care at your doctor's office, retail clinic, urgent care center, or emergency room.  If you have a medical emergency, please immediately call 911 or go to the emergency department.  Pager Numbers  - Dr. Kowalski: [phone]  - Dr. Moye: [phone]  - Dr. Stewart: [phone]  In the event of inclement weather, please call our main line at [phone] for an update on the status of any delays or closures.  Dermatology Medication Tips: Please  keep the boxes that topical medications come in in order to help keep track of the instructions about where and how to use these. Pharmacies typically print the medication instructions only on the boxes and not directly on the medication tubes.   If your medication is too expensive, please contact our office at [phone] option 4 or send us a message through MyChart.   We are unable to tell what your co-pay for medications will be in advance as this is different depending on your insurance coverage. However, we may be able to find a substitute medication at lower cost or fill out paperwork to get insurance to cover a needed medication.   If a prior authorization is required to get your medication covered by your insurance company, please allow us 1-2 business days to complete this process.  Drug prices often vary depending on where the prescription is filled and some pharmacies may offer cheaper prices.  The website www.goodrx.com contains coupons for medications through different pharmacies. The prices here do not account for what the cost may be with help from insurance (it may be cheaper with your insurance), but the website can give you the price if you did not use any insurance.  - You can print the associated coupon and take it with your prescription to the pharmacy.  - You may also stop by our office during regular business hours and pick up a GoodRx coupon card.  - If you need your prescription sent electronically to a different pharmacy, notify our office through [place] MyChart or by phone at [phone] option 4.     Si Usted Necesita Algo Despus de Su Visita  Tambin puede enviarnos un mensaje a travs de MyChart. Por lo general respondemos a los mensajes de MyChart en el transcurso de 1 a 2 das hbiles.  Para renovar recetas, por favor pida a su farmacia que se ponga en contacto con nuestra oficina. Nuestro nmero de fax es el [phone].  Si tiene un asunto urgente  cuando la clnica est cerrada y que no puede esperar hasta el siguiente da hbil, puede llamar/localizar a su doctor(a) al nmero que aparece a continuacin.   Por favor, tenga en cuenta que aunque hacemos todo lo posible para estar disponibles para asuntos urgentes fuera del horario de oficina, no estamos disponibles las 24 horas del da, los 7 das de la semana.   Si tiene un problema urgente y no puede comunicarse con nosotros, puede optar por buscar atencin mdica  en el consultorio de su doctor(a), en una clnica privada, en un centro de atencin urgente o en una sala de emergencias.  Si tiene una emergencia mdica, por favor llame inmediatamente al 911 o vaya a la sala de emergencias.  Nmeros de bper  - Dr. Kowalski: [phone]  - Dra. Moye: [phone]  - Dra. Stewart: [phone]  En caso   de inclemencias del tiempo, por favor llame a nuestra lnea principal al [phone] para una actualizacin sobre el estado de cualquier retraso o cierre.  Consejos para la medicacin en dermatologa: Por favor, guarde las cajas en las que vienen los medicamentos de uso tpico para ayudarle a seguir las instrucciones sobre dnde y cmo usarlos. Las farmacias generalmente imprimen las instrucciones del medicamento slo en las cajas y no directamente en los tubos del medicamento.   Si su medicamento es muy caro, por favor, pngase en contacto con nuestra oficina llamando al [phone] y presione la opcin 4 o envenos un mensaje a travs de MyChart.   No podemos decirle cul ser su copago por los medicamentos por adelantado ya que esto es diferente dependiendo de la cobertura de su seguro. Sin embargo, es posible que podamos encontrar un medicamento sustituto a menor costo o llenar un formulario para que el seguro cubra el medicamento que se considera necesario.   Si se requiere una autorizacin previa para que su compaa de seguros cubra su medicamento, por favor permtanos de 1 a 2  das hbiles para completar este proceso.  Los precios de los medicamentos varan con frecuencia dependiendo del lugar de dnde se surte la receta y alguna farmacias pueden ofrecer precios ms baratos.  El sitio web www.goodrx.com tiene cupones para medicamentos de diferentes farmacias. Los precios aqu no tienen en cuenta lo que podra costar con la ayuda del seguro (puede ser ms barato con su seguro), pero el sitio web puede darle el precio si no utiliz ningn seguro.  - Puede imprimir el cupn correspondiente y llevarlo con su receta a la farmacia.  - Tambin puede pasar por nuestra oficina durante el horario de atencin regular y recoger una tarjeta de cupones de GoodRx.  - Si necesita que su receta se enve electrnicamente a una farmacia diferente, informe a nuestra oficina a travs de MyChart de [place] o por telfono llamando al [phone] y presione la opcin 4.  

## 2022-03-11 NOTE — Progress Notes (Signed)
   Follow-Up Visit   Subjective  Connie Rosales is a 46 y.o. female who presents for the following: Annual Exam.  The patient presents for Total-Body Skin Exam (TBSE) for skin cancer screening and mole check.  The patient has spots, moles and lesions to be evaluated, some may be new or changing. She has a history of dysplastic nevi.    The following portions of the chart were reviewed this encounter and updated as appropriate:       Review of Systems:  No other skin or systemic complaints except as noted in HPI or Assessment and Plan.  Objective  Well appearing patient in no apparent distress; mood and affect are within normal limits.  A full examination was performed including scalp, head, eyes, ears, nose, lips, neck, chest, axillae, abdomen, back, buttocks, bilateral upper extremities, bilateral lower extremities, hands, feet, fingers, toes, fingernails, and toenails. All findings within normal limits unless otherwise noted below.  Left Anterior Thigh 2.5 mm slight irregular medium brown macule  Right Sternum 4.0 mm medium light brown macule  Left Flexor Forearm 2.5 mm regular medium brown macule  right medial scapula 4.0 mm med light brown speckled macule  Left Spinal Lower Back 1.0 cm speckled brown patch - present since childhood, no changes when compared to previous photo    Assessment & Plan  Skin cancer screening performed today.  Actinic Damage - chronic, secondary to cumulative UV radiation exposure/sun exposure over time - diffuse scaly erythematous macules with underlying dyspigmentation - Recommend daily broad spectrum sunscreen SPF 30+ to sun-exposed areas, reapply every 2 hours as needed.  - Recommend staying in the shade or wearing long sleeves, sun glasses (UVA+UVB protection) and wide brim hats (4-inch brim around the entire circumference of the hat). - Call for new or changing lesions.  Lentigines - Scattered tan macules - Due to sun exposure -  Benign-appearing, observe - Recommend daily broad spectrum sunscreen SPF 30+ to sun-exposed areas, reapply every 2 hours as needed. - Call for any changes  Seborrheic Keratoses - Stuck-on, waxy, tan-brown papules and/or plaques  - Benign-appearing - Discussed benign etiology and prognosis. - Observe - Call for any changes  Melanocytic Nevi - Tan-brown and/or pink-flesh-colored symmetric macules and papules - Benign appearing on exam today - Observation - Call clinic for new or changing moles - Recommend daily use of broad spectrum spf 30+ sunscreen to sun-exposed areas.   Hemangiomas - Red papules - Discussed benign nature - Observe - Call for any changes  Nevus (4) Left Flexor Forearm; Left Anterior Thigh; Right Sternum; right medial scapula  Benign-appearing, stable.  Observation.  Call clinic for new or changing moles.  Recommend daily use of broad spectrum spf 30+ sunscreen to sun-exposed areas.   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  Observation.  Call clinic for new or changing moles.  Recommend daily use of broad spectrum spf 30+ sunscreen to sun-exposed areas.     Return in about 1 year (around 03/12/2023) for TBSE, Hx DN.  IJamesetta Orleans, CMA, am acting as scribe for Brendolyn Patty, MD .  Documentation: I have reviewed the above documentation for accuracy and completeness, and I agree with the above.  Brendolyn Patty MD

## 2022-03-20 ENCOUNTER — Ambulatory Visit
Admission: RE | Admit: 2022-03-20 | Discharge: 2022-03-20 | Disposition: A | Discharge status: Home / Self Care | Payer: BC Managed Care – PPO | Source: Ambulatory Visit | Attending: Obstetrics and Gynecology | Admitting: Obstetrics and Gynecology

## 2022-03-20 ENCOUNTER — Other Ambulatory Visit: Payer: Self-pay | Admitting: Obstetrics and Gynecology

## 2022-03-20 DIAGNOSIS — N6011 Diffuse cystic mastopathy of right breast: Secondary | ICD-10-CM | POA: Diagnosis not present

## 2022-03-20 DIAGNOSIS — N6310 Unspecified lump in the right breast, unspecified quadrant: Secondary | ICD-10-CM

## 2022-03-20 DIAGNOSIS — R928 Other abnormal and inconclusive findings on diagnostic imaging of breast: Secondary | ICD-10-CM | POA: Diagnosis not present

## 2022-03-20 HISTORY — PX: BREAST BIOPSY: SHX20

## 2022-05-09 ENCOUNTER — Ambulatory Visit: Payer: BC Managed Care – PPO | Admitting: Family Medicine

## 2022-05-09 ENCOUNTER — Encounter: Payer: Self-pay | Admitting: Family Medicine

## 2022-05-09 VITALS — Ht 62.5 in

## 2022-05-09 DIAGNOSIS — Z13220 Encounter for screening for lipoid disorders: Secondary | ICD-10-CM

## 2022-05-10 ENCOUNTER — Encounter: Payer: Self-pay | Admitting: Family Medicine

## 2022-05-10 ENCOUNTER — Ambulatory Visit (INDEPENDENT_AMBULATORY_CARE_PROVIDER_SITE_OTHER): Payer: BC Managed Care – PPO | Admitting: Family Medicine

## 2022-05-10 VITALS — BP 120/80 | HR 64 | Temp 98.5°F | Ht 62.5 in

## 2022-05-10 DIAGNOSIS — H60501 Unspecified acute noninfective otitis externa, right ear: Secondary | ICD-10-CM

## 2022-05-10 MED ORDER — NEOMYCIN-POLYMYXIN-HC 3.5-10000-1 OT SOLN: 4.0000 [drp] | Freq: Three times a day (TID) | OTIC | 0 refills | Status: AC

## 2022-05-10 NOTE — Progress Notes (Signed)
Patient ID: Connie Rosales, female    DOB: 06-21-1975, 46 y.o.   MRN: [id number]  This visit was conducted in person.  BP 120/80   Pulse 64   Temp 98.5 F (36.9 C) (Oral)   Ht 5' 2.5" (1.588 m)   SpO2 99%   BMI 21.24 kg/m    CC:  Chief Complaint  Patient presents with   Ear Issue    Right-Pressure/Noise    Subjective:   HPI: Connie Rosales is a 46 y.o. female presenting on 05/10/2022 for Ear Issue (Right-Pressure/Noise)   1 week ago she started feeling sensation of air in ear.  Use rubbing alcohol drops. Helped feeling of sensation but was temporarily uncomfortable.  Still feels pressure, not pain.  No fever, no cough, no congestion, no fever.  Hears popping or crackling when yawn.  No hearing loss.  No vertigo.     No history of ear problems.      Relevant past medical, surgical, family and social history reviewed and updated as indicated. Interim medical history since our last visit reviewed. Allergies and medications reviewed and updated. Outpatient Medications Prior to Visit  Medication Sig Dispense Refill   LO LOESTRIN FE 1 MG-10 MCG / 10 MCG tablet Take 1 tablet by mouth daily.  11   Multiple Vitamin (MULTIVITAMIN) tablet Take 1 tablet by mouth daily.     No facility-administered medications prior to visit.     Per HPI unless specifically indicated in ROS section below Review of Systems  Constitutional:  Negative for fatigue and fever.  HENT:  Negative for congestion.   Eyes:  Negative for pain.  Respiratory:  Negative for cough and shortness of breath.   Cardiovascular:  Negative for chest pain, palpitations and leg swelling.  Gastrointestinal:  Negative for abdominal pain.  Genitourinary:  Negative for dysuria and vaginal bleeding.  Musculoskeletal:  Negative for back pain.  Neurological:  Negative for syncope, light-headedness and headaches.  Psychiatric/Behavioral:  Negative for dysphoric mood.    Objective:  BP 120/80   Pulse 64    Temp 98.5 F (36.9 C) (Oral)   Ht 5' 2.5" (1.588 m)   SpO2 99%   BMI 21.24 kg/m   Wt Readings from Last 3 Encounters:  11/26/21 118 lb (53.5 kg)  08/31/21 120 lb (54.4 kg)  07/25/20 120 lb 4 oz (54.5 kg)      Physical Exam Constitutional:      General: She is not in acute distress.    Appearance: Normal appearance. She is well-developed. She is not ill-appearing or toxic-appearing.  HENT:     Head: Normocephalic.     Right Ear: Hearing and external ear normal. Drainage and swelling present. Tympanic membrane is not erythematous, retracted or bulging.     Left Ear: Hearing, tympanic membrane, ear canal and external ear normal. Tympanic membrane is not erythematous, retracted or bulging.     Ears:     Comments: Clear discharge in the ear canal and mild swelling minimal erythema of ear canal    Nose: No mucosal edema or rhinorrhea.     Right Sinus: No maxillary sinus tenderness or frontal sinus tenderness.     Left Sinus: No maxillary sinus tenderness or frontal sinus tenderness.     Mouth/Throat:     Pharynx: Uvula midline.  Eyes:     General: Lids are normal. Lids are everted, no foreign bodies appreciated.     Conjunctiva/sclera: Conjunctivae normal.     Pupils: Pupils  are equal, round, and reactive to light.  Neck:     Thyroid: No thyroid mass or thyromegaly.     Vascular: No carotid bruit.     Trachea: Trachea normal.  Cardiovascular:     Rate and Rhythm: Normal rate and regular rhythm.     Pulses: Normal pulses.     Heart sounds: Normal heart sounds, S1 normal and S2 normal. No murmur heard.    No friction rub. No gallop.  Pulmonary:     Effort: Pulmonary effort is normal. No tachypnea or respiratory distress.     Breath sounds: Normal breath sounds. No decreased breath sounds, wheezing, rhonchi or rales.  Abdominal:     General: Bowel sounds are normal.     Palpations: Abdomen is soft.     Tenderness: There is no abdominal tenderness.  Musculoskeletal:     Cervical  back: Normal range of motion and neck supple.  Skin:    General: Skin is warm and dry.     Findings: No rash.  Neurological:     Mental Status: She is alert.  Psychiatric:        Mood and Affect: Mood is not anxious or depressed.        Speech: Speech normal.        Behavior: Behavior normal. Behavior is cooperative.        Thought Content: Thought content normal.        Judgment: Judgment normal.       Results for orders placed or performed during the hospital encounter of 11/26/21  Pregnancy, urine POC  Result Value Ref Range   Preg Test, Ur NEGATIVE NEGATIVE     COVID 19 screen:  No recent travel or known exposure to [place] The patient denies respiratory symptoms of COVID 19 at this time. The importance of social distancing was discussed today.   Assessment and Plan    Problem List Items Addressed This Visit     Acute otitis externa of right ear - Primary    Acute, mild otitis externa possibly due to irritant as opposed to infection but we will go ahead and treat with otic including cortisone and antibiotic. Return and ER precautions provided Meds ordered this encounter  Medications   neomycin-polymyxin-hydrocortisone (CORTISPORIN) OTIC solution    Sig: Place 4 drops into the right ear 3 (three) times daily for 5 days.    Dispense:  10 mL    Refill:  0          Eliezer Lofts, MD

## 2022-05-10 NOTE — Assessment & Plan Note (Signed)
Acute, mild otitis externa possibly due to irritant as opposed to infection but we will go ahead and treat with otic including cortisone and antibiotic. Return and ER precautions provided Meds ordered this encounter  Medications   neomycin-polymyxin-hydrocortisone (CORTISPORIN) OTIC solution    Sig: Place 4 drops into the right ear 3 (three) times daily for 5 days.    Dispense:  10 mL    Refill:  0

## 2022-06-13 NOTE — Progress Notes (Signed)
Cancelled.  

## 2022-07-22 ENCOUNTER — Encounter: Payer: Self-pay | Admitting: *Deleted

## 2022-07-23 ENCOUNTER — Telehealth (INDEPENDENT_AMBULATORY_CARE_PROVIDER_SITE_OTHER): Payer: BC Managed Care – PPO | Admitting: Family Medicine

## 2022-07-23 ENCOUNTER — Encounter: Payer: Self-pay | Admitting: Family Medicine

## 2022-07-23 VITALS — Ht 62.5 in

## 2022-07-23 DIAGNOSIS — M25511 Pain in right shoulder: Secondary | ICD-10-CM | POA: Insufficient documentation

## 2022-07-23 NOTE — Assessment & Plan Note (Signed)
Acute, no red flags or indication for x-ray Most likely shoulder bursitis versus tendinitis  .  Recommended consideration of ibuprofen 600 to 800 mg 3 times daily with meals as needed for pain and inflammation.  She will go ahead and start home physical therapy and information was provided via [place].  Referral was also placed for formal physical therapy.  If pain not improving as expected she will make an in office visit for full exam

## 2022-07-23 NOTE — Progress Notes (Signed)
VIRTUAL VISIT A virtual visit is felt to be most appropriate for this patient at this time.   I connected with the patient on 07/23/22 at  2:00 PM EST by virtual telehealth platform and verified that I am speaking with the correct person using two identifiers.   I discussed the limitations, risks, security and privacy concerns of performing an evaluation and management service by  virtual telehealth platform and the availability of in person appointments. I also discussed with the patient that there may be a patient responsible charge related to this service. The patient expressed understanding and agreed to proceed.  Patient location: Home Provider Location: [facility] Participants: Eliezer Lofts and [name]   Chief Complaint  Patient presents with   Shoulder Pain    Right Shoulder-No injury-Been bothering her since November Wants to Discuss referral to PT    History of Present Illness:  47 y.o. female patient of Jimi Giza E, MD presents with  right shoulder pain.   She reports new onset pain ion right shoulder ongoing x  5 months.  No specific known fall or injury. No change in activity.,  Noted first when sleeping on left side.. would wake up with shooting pain when sleeping on right side.   Occ pain with moving shoulder rapidly.  Now in last 4-5 weeks pain is increasing.. pain with putting on coat and  sweatshirt. Still fairly good ROM, but sharp pain with pushing open heavy door.   She is still doing yoga.   She has not used any OTC medication to treat.   No numbness, no weakness in arm, good ROM of neck.. pain not triggered     COVID 19 screen No recent travel or known exposure to [place] The patient denies respiratory symptoms of COVID 19 at this time.  The importance of social distancing was discussed today.   Review of Systems  Constitutional:  Negative for chills and fever.  HENT:  Negative for congestion and ear pain.   Eyes:  Negative for  pain and redness.  Respiratory:  Negative for cough and shortness of breath.   Cardiovascular:  Negative for chest pain, palpitations and leg swelling.  Gastrointestinal:  Negative for abdominal pain, blood in stool, constipation, diarrhea, nausea and vomiting.  Genitourinary:  Negative for dysuria.  Musculoskeletal:  Negative for falls and myalgias.  Skin:  Negative for rash.  Neurological:  Negative for dizziness.  Psychiatric/Behavioral:  Negative for depression. The patient is not nervous/anxious.       Past Medical History:  Diagnosis Date   History of dysplastic nevus 2013   right upper back spinal, left ant prox thigh near groin   History of dysplastic nevus 03/06/2020   L ant thigh, mild atypia   History of dysplastic nevus 03/06/2020   L post shoulder, moderate atypia    reports that she has never smoked. She has never been exposed to tobacco smoke. She has never used smokeless tobacco. She reports current alcohol use. She reports that she does not use drugs.   Current Outpatient Medications:    LO LOESTRIN FE 1 MG-10 MCG / 10 MCG tablet, Take 1 tablet by mouth daily., Disp: , Rfl: 11   Multiple Vitamin (MULTIVITAMIN) tablet, Take 1 tablet by mouth daily., Disp: , Rfl:    Observations/Objective: Height 5' 2.5" (1.588 m).  Physical Exam Constitutional:      General: The patient is not in acute distress. Pulmonary:     Effort: Pulmonary effort is  normal. No respiratory distress.  Neurological:     Mental Status: The patient is alert and oriented to person, place, and time.  Psychiatric:        Mood and Affect: Mood normal.        Behavior: Behavior normal.   Musculoskeletal: Appears to have good range of motion of neck and right shoulder.  Pain most significant with extreme abduction and internal rotation.  Negative drop arm test and negative Neer's test when performed on self at home. Pain appears to be primarily over her anterior shoulder bursa  Assessment and  Plan Acute pain of right shoulder Assessment & Plan: Acute, no red flags or indication for x-ray Most likely shoulder bursitis versus tendinitis  .  Recommended consideration of ibuprofen 600 to 800 mg 3 times daily with meals as needed for pain and inflammation.  She will go ahead and start home physical therapy and information was provided via [place].  Referral was also placed for formal physical therapy.  If pain not improving as expected she will make an in office visit for full exam  Orders: -     Ambulatory referral to Physical Therapy      I discussed the assessment and treatment plan with the patient. The patient was provided an opportunity to ask questions and all were answered. The patient agreed with the plan and demonstrated an understanding of the instructions.   The patient was advised to call back or seek an in-person evaluation if the symptoms worsen or if the condition fails to improve as anticipated.     Eliezer Lofts, MD

## 2022-08-14 ENCOUNTER — Telehealth: Payer: Self-pay | Admitting: *Deleted

## 2022-08-14 DIAGNOSIS — Z13220 Encounter for screening for lipoid disorders: Secondary | ICD-10-CM

## 2022-08-14 DIAGNOSIS — D509 Iron deficiency anemia, unspecified: Secondary | ICD-10-CM

## 2022-08-14 NOTE — Telephone Encounter (Signed)
-----   Message from Ellamae Sia sent at 08/14/2022  2:44 PM EDT ----- Regarding: Lab orders for Tuesday,4.9.24 Patient is scheduled for CPX labs, please order future labs, Thanks , Karna Christmas

## 2022-08-27 ENCOUNTER — Other Ambulatory Visit (INDEPENDENT_AMBULATORY_CARE_PROVIDER_SITE_OTHER): Payer: BC Managed Care – PPO

## 2022-08-27 DIAGNOSIS — D509 Iron deficiency anemia, unspecified: Secondary | ICD-10-CM

## 2022-08-27 DIAGNOSIS — Z13220 Encounter for screening for lipoid disorders: Secondary | ICD-10-CM | POA: Diagnosis not present

## 2022-08-27 LAB — CBC WITH DIFFERENTIAL/PLATELET
Basophils Absolute: 0 10*3/uL (ref 0.0–0.1)
Basophils Relative: 0.5 % (ref 0.0–3.0)
Eosinophils Absolute: 0.4 10*3/uL (ref 0.0–0.7)
Eosinophils Relative: 8.2 % — ABNORMAL HIGH (ref 0.0–5.0)
HCT: 38 % (ref 36.0–46.0)
Hemoglobin: 12.7 g/dL (ref 12.0–15.0)
Lymphocytes Relative: 30.6 % (ref 12.0–46.0)
Lymphs Abs: 1.5 10*3/uL (ref 0.7–4.0)
MCHC: 33.4 g/dL (ref 30.0–36.0)
MCV: 95.9 fl (ref 78.0–100.0)
Monocytes Absolute: 0.3 10*3/uL (ref 0.1–1.0)
Monocytes Relative: 6.1 % (ref 3.0–12.0)
Neutro Abs: 2.7 10*3/uL (ref 1.4–7.7)
Neutrophils Relative %: 54.6 % (ref 43.0–77.0)
Platelets: 277 10*3/uL (ref 150.0–400.0)
RBC: 3.96 Mil/uL (ref 3.87–5.11)
RDW: 12.9 % (ref 11.5–15.5)
WBC: 4.9 10*3/uL (ref 4.0–10.5)

## 2022-08-27 LAB — IBC + FERRITIN
Ferritin: 54.4 ng/mL (ref 10.0–291.0)
Iron: 137 ug/dL (ref 42–145)
Saturation Ratios: 35.6 % (ref 20.0–50.0)
TIBC: 385 ug/dL (ref 250.0–450.0)
Transferrin: 275 mg/dL (ref 212.0–360.0)

## 2022-08-27 LAB — COMPREHENSIVE METABOLIC PANEL
ALT: 11 U/L (ref 0–35)
AST: 17 U/L (ref 0–37)
Albumin: 4.3 g/dL (ref 3.5–5.2)
Alkaline Phosphatase: 74 U/L (ref 39–117)
BUN: 12 mg/dL (ref 6–23)
CO2: 26 mEq/L (ref 19–32)
Calcium: 9.3 mg/dL (ref 8.4–10.5)
Chloride: 102 mEq/L (ref 96–112)
Creatinine, Ser: 0.76 mg/dL (ref 0.40–1.20)
GFR: 93.43 mL/min (ref >60.00)
Glucose, Bld: 84 mg/dL (ref 70–99)
Potassium: 3.9 mEq/L (ref 3.5–5.1)
Sodium: 137 mEq/L (ref 135–145)
Total Bilirubin: 0.5 mg/dL (ref 0.2–1.2)
Total Protein: 7.4 g/dL (ref 6.0–8.3)

## 2022-08-27 LAB — LIPID PANEL
Cholesterol: 219 mg/dL — ABNORMAL HIGH (ref 0–200)
HDL: 86.1 mg/dL (ref >39.00)
LDL Cholesterol: 110 mg/dL — ABNORMAL HIGH (ref 0–99)
NonHDL: 132.48
Total CHOL/HDL Ratio: 3
Triglycerides: 112 mg/dL (ref 0.0–149.0)
VLDL: 22.4 mg/dL (ref 0.0–40.0)

## 2022-08-27 NOTE — Progress Notes (Signed)
No critical labs need to be addressed urgently. We will discuss labs in detail at upcoming office visit.   

## 2022-08-28 NOTE — Therapy (Addendum)
OUTPATIENT PHYSICAL THERAPY SHOULDER EVALUATION   Patient Name: Connie SanesMeredith L Roggenkamp MRN: [id number] DOB:23-Sep-1975, 47 y.o., female Today's Date: 08/29/2022  END OF SESSION:  PT End of Session - 08/29/22 1006     Visit Number 1    Number of Visits 30    Date for PT Re-Evaluation 11/21/22    Authorization - Visit Number 1    Authorization - Number of Visits 30    Progress Note Due on Visit 10    PT Start Time [phone]    PT Stop Time 1045    PT Time Calculation (min) 53 min    Activity Tolerance Patient tolerated treatment well    Behavior [facility] Therapy [facility] for tasks assessed/performed             Past Medical History:  Diagnosis Date   History of dysplastic nevus 2013   right upper back spinal, left ant prox thigh near groin   History of dysplastic nevus 03/06/2020   L ant thigh, mild atypia   History of dysplastic nevus 03/06/2020   L post shoulder, moderate atypia   Past Surgical History:  Procedure Laterality Date   arhtroscopic meniscal repair  2003   ARTHROSCOPIC REPAIR ACL  2002   right    COLONOSCOPY WITH PROPOFOL N/A 11/26/2021   Procedure: COLONOSCOPY WITH PROPOFOL;  Surgeon: Toney ReilVanga, Rohini Reddy, MD;  Location: [facility] ENDOSCOPY;  Service: Gastroenterology;  Laterality: N/A;   Patient Active Problem List   Diagnosis Date Noted   Acute pain of right shoulder 07/23/2022   Acute otitis externa of right ear 05/10/2022   Colon cancer screening    Deviated nasal septum 11/09/2020   Epistaxis 07/25/2020   Family history of malignant melanoma of skin 02/04/2012   Iron deficiency anemia 10/16/2006   COMMON MIGRAINE 10/16/2006   ALLERGIC RHINITIS 10/16/2006    PCP: Excell SeltzerBedsole, [name], MD   REFERRING PROVIDER: Excell SeltzerBedsole, [name], MD   REFERRING DIAG: M25.511 (ICD-10-CM) - Acute pain of right shoulder   THERAPY DIAG:  Acute pain of right shoulder  Decreased ROM of right shoulder  Decreased ROM of neck  Neck pain  Rationale for Evaluation and Treatment:  Rehabilitation  ONSET DATE: 03/2022  SUBJECTIVE:                                                                                                                                                                                      SUBJECTIVE STATEMENT:  Pt reports that pain in her shoulder started in early November of 2023.  Pt states she started feeling it at night and it would wake her up.  It was more of a lightning shooting  pain.  Pt stated that it only was happening at night, but then started being when she was awake and moving.  She states that it progressively worsened around February/March.  She was prescribed Aleve to alleviate the pain.  She's noticed it being better, noting a reduced frequency of the pain and it being a "different pain".  Pt notes that her ROM is reduced when performing Yoga and when getting ready with donning/doffing.  Pt states that wiping the board at school with her R arm was difficult in January.  Pt is currently on teaching sabbatical and is just wanting to increase the ROM and reduce the pain as she can.  Hand dominance: Left, but does everything R handed other than writing.  PERTINENT HISTORY: Per [facility] 47 y.o. female patient of [name], [name], MD presents with  right shoulder pain.    She reports new onset pain ion right shoulder ongoing x  5 months.  No specific known fall or injury. No change in activity.,  Noted first when sleeping on left side.. would wake up with shooting pain when sleeping on right side.   Occ pain with moving shoulder rapidly.  Now in last 4-5 weeks pain is increasing.. pain with putting on coat and  sweatshirt. Still fairly good ROM, but sharp pain with pushing open heavy door.    She is still doing yoga.    She has not used any OTC medication to treat.    No numbness, no weakness in arm, good ROM of neck.. pain not triggered   PAIN:  Are you having pain? Yes: NPRS scale: 2/10 Pain location: R neck and R shoulder Pain  description: sore Aggravating factors: lying on the R side, reaching high and ER/IR rotation, getting dressed Relieving factors: pain medications once a day, sometimes not at all.  PRECAUTIONS: None  WEIGHT BEARING RESTRICTIONS: No  FALLS:  Has patient fallen in last 6 months? Yes. Number of falls 1, fell at a restaurant and caught herself on the bench  LIVING ENVIRONMENT: Lives with: lives with their family Lives in: House/apartment Stairs: Yes: Internal: 14 steps; on left going up and External: 4 steps; none Has following equipment at home: None  OCCUPATION: Professor at [employer]  PLOF: Independent  PATIENT GOALS: Increase ROM and reduce pain  NEXT MD VISIT: 09/03/22  OBJECTIVE:   DIAGNOSTIC FINDINGS:  No imaging performed  PATIENT SURVEYS:  FOTO 63  COGNITION: Overall cognitive status: Within functional limits for tasks assessed     SENSATION: WFL  POSTURE: Good posture in sitting   CERVICAL ROM:   Active ROM A/PROM (deg) eval  Flexion 47 deg  Extension 30 deg  Right lateral flexion 33 deg  Left lateral flexion 43 deg  Right rotation 44 deg  Left rotation 55 deg   (Blank rows = not tested)   UPPER EXTREMITY ROM:   Active ROM Right eval Left eval  Shoulder flexion 105 deg 157 deg  Shoulder extension 116 deg 170 deg  Shoulder abduction    Shoulder adduction    Shoulder internal rotation    Shoulder external rotation    Elbow flexion    Elbow extension    Wrist flexion    Wrist extension    Wrist ulnar deviation    Wrist radial deviation    Wrist pronation    Wrist supination    (Blank rows = not tested)  UPPER EXTREMITY MMT:  MMT Right eval Left eval  Shoulder flexion 4 4+  Shoulder  extension    Shoulder abduction 4- 4+  Shoulder adduction    Shoulder internal rotation    Shoulder external rotation    Middle trapezius    Lower trapezius    Elbow flexion    Elbow extension    Wrist flexion    Wrist extension    Wrist ulnar  deviation    Wrist radial deviation    Wrist pronation    Wrist supination    Grip strength (lbs)    (Blank rows = not tested)  SHOULDER SPECIAL TESTS: Impingement tests: Neer impingement test: positive , Hawkins/Kennedy impingement test: negative, and Painful arc test: negative Rotator cuff assessment: Empty can test: positive , Full can test: positive , and Belly press test: positive  Biceps assessment: Yergason's test: negative and Speed's test: negative  JOINT MOBILITY TESTING:   Pt with significant joint mobility restrictions in AP and inferior glides of the R shoulder in supine position  PALPATION:   Pt with tenderness with palpation along the UT's with more pain present on the R side.  Pt also noted tenderness along the supraspinatus and infraspinatus musculature of the R side.  TP's noted in region.   TODAY'S TREATMENT: DATE: 08/29/22   TherEx:  Pt given HEP and educated on how to perform properly before leaving clinic.    Pt demonstrated ability to perform the exercises listed below:   Exercises - Isometric Shoulder Flexion at Wall  - 1 x daily - 7 x weekly - 3 sets - 10 reps - Isometric Shoulder Extension at Wall  - 1 x daily - 7 x weekly - 3 sets - 10 reps - Isometric Shoulder Abduction at Wall  - 1 x daily - 7 x weekly - 3 sets - 10 reps - Isometric Shoulder Adduction  - 1 x daily - 7 x weekly - 3 sets - 10 reps  PATIENT EDUCATION: Education details: Pt educated throughout session about proper posture and technique with exercises. Improved exercise technique, movement at target joints, use of target muscles after min to mod verbal, visual, tactile cues Person educated: Patient Education method: Explanation, Demonstration, Tactile cues, Verbal cues, and Handouts Education comprehension: verbalized understanding, returned demonstration, and verbal cues required  HOME EXERCISE PROGRAM:  Access Code: [email or web address] URL: [email or web address] Date:  08/29/2022 Prepared by: Tomasa Hose  Exercises - Isometric Shoulder Flexion at Wall  - 1 x daily - 7 x weekly - 3 sets - 10 reps - Isometric Shoulder Extension at Wall  - 1 x daily - 7 x weekly - 3 sets - 10 reps - Isometric Shoulder Abduction at Wall  - 1 x daily - 7 x weekly - 3 sets - 10 reps - Isometric Shoulder Adduction  - 1 x daily - 7 x weekly - 3 sets - 10 reps  ASSESSMENT:  CLINICAL IMPRESSION:  Patient is a 47 y.o. female who was seen today for physical therapy evaluation and treatment for R shoulder ROM deficits and pain with mobilization.  Pt was independent prior to initial onset and is still able to remain independent, however has been having increased difficulty with tasks related to yoga and other hobbies.  Pt presents with physical impairments of decreased activity tolerance, decreased ROM of R shoulder,  increased pain in R shoulder joint with mobility, and decreased strength in R shoulder as noted.  Pt will benefit from skilled therapy to address tolerance, ROM, pain, and strength impairments necessary for improvement in quality of life.  Pt. demonstrates understanding of  this plan of care and agrees with this plan.   OBJECTIVE IMPAIRMENTS: decreased ROM, decreased strength, and pain.   ACTIVITY LIMITATIONS: lifting, dressing, and reach over head  PARTICIPATION LIMITATIONS: occupation  PERSONAL FACTORS: Fitness, Profession, and Time since onset of injury/illness/exacerbation are also affecting patient's functional outcome.   REHAB POTENTIAL: Good  CLINICAL DECISION MAKING: Stable/uncomplicated  EVALUATION COMPLEXITY: Low   GOALS: Goals reviewed with patient? Yes  SHORT TERM GOALS: Target date: 09/26/2022  Pt will be independent with HEP in order to demonstrate increased ability to perform tasks related to occupation/hobbies. Baseline: Pt given HEP at [facility]. Goal status: INITIAL  LONG TERM GOALS: Target date: 11/21/2022  Patient will demonstrate improved  function as evidenced by a score of 72 on FOTO measure for full participation in activities at home and in the community. Baseline: FOTO: 63 Goal status: INITIAL  2.  Pt will improve R shoulder ROM to be WFL (120 deg flexion/130 deg abduction) Baseline: R shoulder flexion: 105 deg; abduction 116 deg Goal status: INITIAL  3.  Pt will be able to donn clothing in morning with self reported 0/10 pain. Baseline: 2/10 pain at rest. Goal status: INITIAL  4.  Pt will improve cervical rotation ROM to be WFL (80 deg) in order to demonstrate improved ability to drive and look at blind spots.   Baseline: R/L Cervical Rotation: 44 deg/55 deg Goal status: INITIAL  PLAN:  PT FREQUENCY: 2x/week  PT DURATION: 12 weeks  PLANNED INTERVENTIONS: Therapeutic exercises, Therapeutic activity, Neuromuscular re-education, Balance training, Gait training, Patient/Family education, Self Care, Joint mobilization, Joint manipulation, Vestibular training, Canalith repositioning, Dry Needling, Spinal manipulation, Spinal mobilization, Moist heat, Manual therapy, and Re-evaluation  PLAN FOR NEXT SESSION: Assess neck for potential dry needling, heavy manual therapy within tolerance for shoulder mobility   Nolon Bussing, PT, DPT Physical Therapist - [facility]  [facility]  08/29/22, 4:06 PM

## 2022-08-29 ENCOUNTER — Ambulatory Visit: Payer: BC Managed Care – PPO | Attending: Family Medicine

## 2022-08-29 DIAGNOSIS — M25511 Pain in right shoulder: Secondary | ICD-10-CM | POA: Diagnosis not present

## 2022-08-29 DIAGNOSIS — R29898 Other symptoms and signs involving the musculoskeletal system: Secondary | ICD-10-CM | POA: Diagnosis not present

## 2022-08-29 DIAGNOSIS — M542 Cervicalgia: Secondary | ICD-10-CM | POA: Insufficient documentation

## 2022-08-29 DIAGNOSIS — M25611 Stiffness of right shoulder, not elsewhere classified: Secondary | ICD-10-CM | POA: Diagnosis not present

## 2022-09-02 ENCOUNTER — Ambulatory Visit: Payer: BC Managed Care – PPO

## 2022-09-02 DIAGNOSIS — R29898 Other symptoms and signs involving the musculoskeletal system: Secondary | ICD-10-CM

## 2022-09-02 DIAGNOSIS — M542 Cervicalgia: Secondary | ICD-10-CM

## 2022-09-02 DIAGNOSIS — M25511 Pain in right shoulder: Secondary | ICD-10-CM

## 2022-09-02 DIAGNOSIS — M25611 Stiffness of right shoulder, not elsewhere classified: Secondary | ICD-10-CM | POA: Diagnosis not present

## 2022-09-02 NOTE — Therapy (Signed)
OUTPATIENT PHYSICAL THERAPY SHOULDER EVALUATION   Patient Name: Connie Rosales MRN: [id number] DOB:March 24, 1976, 47 y.o., female Today's Date: 09/02/2022  END OF SESSION:  PT End of Session - 09/02/22 1133     Visit Number 2    Number of Visits 30    Date for PT Re-Evaluation 11/21/22    Authorization - Visit Number 2    Authorization - Number of Visits 30    Progress Note Due on Visit 10    PT Start Time 1133    PT Stop Time 1215    PT Time Calculation (min) 42 min    Activity Tolerance Patient tolerated treatment well    Behavior During Therapy [facility] for tasks assessed/performed              Past Medical History:  Diagnosis Date   History of dysplastic nevus 2013   right upper back spinal, left ant prox thigh near groin   History of dysplastic nevus 03/06/2020   L ant thigh, mild atypia   History of dysplastic nevus 03/06/2020   L post shoulder, moderate atypia   Past Surgical History:  Procedure Laterality Date   arhtroscopic meniscal repair  2003   ARTHROSCOPIC REPAIR ACL  2002   right    COLONOSCOPY WITH PROPOFOL N/A 11/26/2021   Procedure: COLONOSCOPY WITH PROPOFOL;  Surgeon: Toney Reil, MD;  Location: [facility];  Service: Gastroenterology;  Laterality: N/A;   Patient Active Problem List   Diagnosis Date Noted   Acute pain of right shoulder 07/23/2022   Acute otitis externa of right ear 05/10/2022   Colon cancer screening    Deviated nasal septum 11/09/2020   Epistaxis 07/25/2020   Family history of malignant melanoma of skin 02/04/2012   Iron deficiency anemia 10/16/2006   COMMON MIGRAINE 10/16/2006   ALLERGIC RHINITIS 10/16/2006    PCP: Excell Seltzer, MD   REFERRING PROVIDER: Excell Seltzer, MD   REFERRING DIAG: M25.511 (ICD-10-CM) - Acute pain of right shoulder   THERAPY DIAG:  Acute pain of right shoulder  Decreased ROM of right shoulder  Decreased ROM of neck  Neck pain  Rationale for Evaluation and Treatment:  Rehabilitation  ONSET DATE: 03/2022  SUBJECTIVE:                                                                                                                                                                                      SUBJECTIVE STATEMENT:  Pt reports she had a good weekend, and that the shoulder abduction exercise was the most difficult/painful, but she was able to tolerate it.  Pt is still in 2/10 pain, but supports that her  neck is doing a little better.  Hand dominance: Left, but does everything R handed other than writing.  PERTINENT HISTORY: Per MDL 47 y.o. female patient of [name], [name], MD presents with  right shoulder pain.    She reports new onset pain ion right shoulder ongoing x  5 months.  No specific known fall or injury. No change in activity.,  Noted first when sleeping on left side.. would wake up with shooting pain when sleeping on right side.   Occ pain with moving shoulder rapidly.  Now in last 4-5 weeks pain is increasing.. pain with putting on coat and  sweatshirt. Still fairly good ROM, but sharp pain with pushing open heavy door.    She is still doing yoga.    She has not used any OTC medication to treat.    No numbness, no weakness in arm, good ROM of neck.. pain not triggered   PAIN:  Are you having pain? Yes: NPRS scale: 2/10 Pain location: R neck and R shoulder Pain description: sore Aggravating factors: lying on the R side, reaching high and ER/IR rotation, getting dressed Relieving factors: pain medications once a day, sometimes not at all.  PRECAUTIONS: None  WEIGHT BEARING RESTRICTIONS: No  FALLS:  Has patient fallen in last 6 months? Yes. Number of falls 1, fell at a restaurant and caught herself on the bench  LIVING ENVIRONMENT: Lives with: lives with their family Lives in: House/apartment Stairs: Yes: Internal: 14 steps; on left going up and External: 4 steps; none Has following equipment at home:  None  OCCUPATION: Professor at [employer]  PLOF: Independent  PATIENT GOALS: Increase ROM and reduce pain  NEXT MD VISIT: 09/03/22  OBJECTIVE:   DIAGNOSTIC FINDINGS:  No imaging performed  PATIENT SURVEYS:  FOTO 63  COGNITION: Overall cognitive status: Within functional limits for tasks assessed     SENSATION: WFL  POSTURE: Good posture in sitting   CERVICAL ROM:   Active ROM A/PROM (deg) eval  Flexion 47 deg  Extension 30 deg  Right lateral flexion 33 deg  Left lateral flexion 43 deg  Right rotation 44 deg  Left rotation 55 deg   (Blank rows = not tested)   UPPER EXTREMITY ROM:   Active ROM Right eval Left eval  Shoulder flexion 105 deg 157 deg  Shoulder extension 116 deg 170 deg  Shoulder abduction    Shoulder adduction    Shoulder internal rotation    Shoulder external rotation    Elbow flexion    Elbow extension    Wrist flexion    Wrist extension    Wrist ulnar deviation    Wrist radial deviation    Wrist pronation    Wrist supination    (Blank rows = not tested)  UPPER EXTREMITY MMT:  MMT Right eval Left eval  Shoulder flexion 4 4+  Shoulder extension    Shoulder abduction 4- 4+  Shoulder adduction    Shoulder internal rotation    Shoulder external rotation    Middle trapezius    Lower trapezius    Elbow flexion    Elbow extension    Wrist flexion    Wrist extension    Wrist ulnar deviation    Wrist radial deviation    Wrist pronation    Wrist supination    Grip strength (lbs)    (Blank rows = not tested)  SHOULDER SPECIAL TESTS: Impingement tests: Neer impingement test: positive , Hawkins/Kennedy impingement test: negative, and Painful arc test:  negative Rotator cuff assessment: Empty can test: positive , Full can test: positive , and Belly press test: positive  Biceps assessment: Yergason's test: negative and Speed's test: negative  JOINT MOBILITY TESTING:   Pt with significant joint mobility restrictions in AP and inferior  glides of the R shoulder in supine position  PALPATION:   Pt with tenderness with palpation along the UT's with more pain present on the R side.  Pt also noted tenderness along the supraspinatus and infraspinatus musculature of the R side.  TP's noted in region.   TODAY'S TREATMENT: DATE: 09/02/22   TherEx:  Seated shoulder pulleys, 2x15 each direction (flexion/scaption/abduction) Serratus shoulder rolls with blue foam roller, x10   Manual:  Supine STM to cervical region to increase extensibility of the paraspinals Supine suboccipital release technique to decrease cervicalgia Supine manual traction performed in order to increase joint space in cervical region for pain relief Supine cervical upglides/downglides, 30 sec bouts Supine UT/Levator stretch, 30 sec bouts to increase tissue extensibility of the cervical region Supine PA's to the R shoulder, Grades II-III for pain modulation and improved ROM, 30 sec bouts x multiple attempts Supine inferior glides to the R shoulder, Grades II-III for pain modulation and improved ROM, 30 sec bouts x multiple attempts     PATIENT EDUCATION: Education details: Pt educated throughout session about proper posture and technique with exercises. Improved exercise technique, movement at target joints, use of target muscles after min to mod verbal, visual, tactile cues Person educated: Patient Education method: Explanation, Demonstration, Tactile cues, Verbal cues, and Handouts Education comprehension: verbalized understanding, returned demonstration, and verbal cues required  HOME EXERCISE PROGRAM:  Access Code: [email or web address] URL: [email or web address] Date: 08/29/2022 Prepared by: Tomasa Hose  Exercises - Isometric Shoulder Flexion at Wall  - 1 x daily - 7 x weekly - 3 sets - 10 reps - Isometric Shoulder Extension at Wall  - 1 x daily - 7 x weekly - 3 sets - 10 reps - Isometric Shoulder Abduction at Wall  - 1 x daily - 7 x weekly  - 3 sets - 10 reps - Isometric Shoulder Adduction  - 1 x daily - 7 x weekly - 3 sets - 10 reps  ASSESSMENT:  CLINICAL IMPRESSION:  Pt responded well to the exercises and manual therapy approaches.  Pt was able to demonstrate improved ROM at the conclusion of the session.  Pt does have some scapular dyskinesia during elevation of the R arm.  Scapula tends to elevate rather than upward rotation and will be addressed at the next session.   Pt will continue to benefit from skilled therapy to address remaining deficits in order to improve overall QoL and return to PLOF.     OBJECTIVE IMPAIRMENTS: decreased ROM, decreased strength, and pain.   ACTIVITY LIMITATIONS: lifting, dressing, and reach over head  PARTICIPATION LIMITATIONS: occupation  PERSONAL FACTORS: Fitness, Profession, and Time since onset of injury/illness/exacerbation are also affecting patient's functional outcome.   REHAB POTENTIAL: Good  CLINICAL DECISION MAKING: Stable/uncomplicated  EVALUATION COMPLEXITY: Low   GOALS: Goals reviewed with patient? Yes  SHORT TERM GOALS: Target date: 09/26/2022  Pt will be independent with HEP in order to demonstrate increased ability to perform tasks related to occupation/hobbies. Baseline: Pt given HEP at IE. Goal status: INITIAL  LONG TERM GOALS: Target date: 11/21/2022  Patient will demonstrate improved function as evidenced by a score of 72 on FOTO measure for full participation in activities at home and in the community. Baseline: FOTO:  63 Goal status: INITIAL  2.  Pt will improve R shoulder ROM to be WFL (120 deg flexion/130 deg abduction) Baseline: R shoulder flexion: 105 deg; abduction 116 deg Goal status: INITIAL  3.  Pt will be able to donn clothing in morning with self reported 0/10 pain. Baseline: 2/10 pain at rest. Goal status: INITIAL  4.  Pt will improve cervical rotation ROM to be WFL (80 deg) in order to demonstrate improved ability to drive and look at blind  spots.   Baseline: R/L Cervical Rotation: 44 deg/55 deg Goal status: INITIAL  PLAN:  PT FREQUENCY: 2x/week  PT DURATION: 12 weeks  PLANNED INTERVENTIONS: Therapeutic exercises, Therapeutic activity, Neuromuscular re-education, Balance training, Gait training, Patient/Family education, Self Care, Joint mobilization, Joint manipulation, Vestibular training, Canalith repositioning, Dry Needling, Spinal manipulation, Spinal mobilization, Moist heat, Manual therapy, and Re-evaluation  PLAN FOR NEXT SESSION: Assess neck for potential dry needling, heavy manual therapy within tolerance for shoulder mobility   Nolon Bussing, PT, DPT Physical Therapist - [facility]  [facility]  09/02/22, 1:43 PM

## 2022-09-03 ENCOUNTER — Ambulatory Visit (INDEPENDENT_AMBULATORY_CARE_PROVIDER_SITE_OTHER): Payer: BC Managed Care – PPO | Admitting: Family Medicine

## 2022-09-03 ENCOUNTER — Encounter: Payer: Self-pay | Admitting: Family Medicine

## 2022-09-03 VITALS — BP 100/60 | HR 82 | Temp 98.6°F | Ht 62.0 in

## 2022-09-03 DIAGNOSIS — M25511 Pain in right shoulder: Secondary | ICD-10-CM

## 2022-09-03 DIAGNOSIS — Z0000 Encounter for general adult medical examination without abnormal findings: Secondary | ICD-10-CM

## 2022-09-03 NOTE — Assessment & Plan Note (Signed)
Has improved with NSAIDs (SE).. just now started PT.

## 2022-09-03 NOTE — Progress Notes (Signed)
Patient ID: Connie Rosales, female    DOB: 1976/04/05, 47 y.o.   MRN: [id number]  This visit was conducted in person.  BP 100/60   Pulse 82   Temp 98.6 F (37 C) (Temporal)   Ht  (1.575 m)   SpO2 100%   BMI 21.58 kg/m    CC:  Chief Complaint  Patient presents with   Annual Exam    Subjective:   HPI: Connie Rosales is a 47 y.o. female presenting on 09/03/2022 for Annual Exam Reviewed labs in detail with patient.  Questions answered.   Lab Results  Component Value Date   CHOL 219 (H) 08/27/2022   HDL 86.10 08/27/2022   LDLCALC 110 (H) 08/27/2022   LDLDIRECT 107.0 01/28/2012   TRIG 112.0 08/27/2022   CHOLHDL 3 08/27/2022   The 10-year ASCVD risk score (Arnett DK, et al., 2019) is: 0.4%   Values used to calculate the score:     Age: 59 years     Sex: Female     Is Non-Hispanic African American: No     Diabetic: No     Tobacco smoker: No     Systolic Blood Pressure: 100 mmHg     Is BP treated: No     HDL Cholesterol: 86.1 mg/dL     Total Cholesterol: 219 mg/dL  Common migraine: Doing well, rare occurrence on OCP.   Iron deficiency anemia:  resolved Hg 12.7, iron panel normal  Diet: heart healthy diet... vegetarian Exercise: yoga twice a week. Occ running.   Pt refused to weigh today. Wt Readings from Last 3 Encounters:  11/26/21 118 lb (53.5 kg)  08/31/21 120 lb (54.4 kg)  07/25/20 120 lb 4 oz (54.5 kg)   Body mass index is 21.58 kg/m.      Flowsheet Row Office Visit from 09/03/2022 in [facility] [place]  PHQ-2 Total Score 0       Relevant past medical, surgical, family and social history reviewed and updated as indicated. Interim medical history since our last visit reviewed. Allergies and medications reviewed and updated. Outpatient Medications Prior to Visit  Medication Sig Dispense Refill   LO LOESTRIN FE 1 MG-10 MCG / 10 MCG tablet Take 1 tablet by mouth daily.  11   Multiple Vitamin (MULTIVITAMIN)  tablet Take 1 tablet by mouth daily.     No facility-administered medications prior to visit.     Per HPI unless specifically indicated in ROS section below Review of Systems  Constitutional:  Negative for fatigue and fever.  HENT:  Negative for congestion.   Eyes:  Negative for pain.  Respiratory:  Negative for cough and shortness of breath.   Cardiovascular:  Negative for chest pain, palpitations and leg swelling.  Gastrointestinal:  Negative for abdominal pain.  Genitourinary:  Negative for dysuria and vaginal bleeding.  Musculoskeletal:  Negative for back pain.  Neurological:  Negative for syncope, light-headedness and headaches.  Psychiatric/Behavioral:  Negative for dysphoric mood.    Objective:  BP 100/60   Pulse 82   Temp 98.6 F (37 C) (Temporal)   Ht  (1.575 m)   SpO2 100%   BMI 21.58 kg/m   Wt Readings from Last 3 Encounters:  11/26/21 118 lb (53.5 kg)  08/31/21 120 lb (54.4 kg)  07/25/20 120 lb 4 oz (54.5 kg)      Physical Exam Vitals and nursing note reviewed.  Constitutional:      General: She is  not in acute distress.    Appearance: Normal appearance. She is well-developed. She is not ill-appearing or toxic-appearing.  HENT:     Head: Normocephalic.     Right Ear: Hearing, tympanic membrane, ear canal and external ear normal.     Left Ear: Hearing, tympanic membrane, ear canal and external ear normal.     Nose: Nose normal.  Eyes:     General: Lids are normal. Lids are everted, no foreign bodies appreciated.     Conjunctiva/sclera: Conjunctivae normal.     Pupils: Pupils are equal, round, and reactive to light.  Neck:     Thyroid: No thyroid mass or thyromegaly.     Vascular: No carotid bruit.     Trachea: Trachea normal.  Cardiovascular:     Rate and Rhythm: Normal rate and regular rhythm.     Heart sounds: Normal heart sounds, S1 normal and S2 normal. No murmur heard.    No gallop.  Pulmonary:     Effort: Pulmonary effort is normal. No  respiratory distress.     Breath sounds: Normal breath sounds. No wheezing, rhonchi or rales.  Abdominal:     General: Bowel sounds are normal. There is no distension or abdominal bruit.     Palpations: Abdomen is soft. There is no fluid wave or mass.     Tenderness: There is no abdominal tenderness. There is no guarding or rebound.     Hernia: No hernia is present.  Musculoskeletal:     Cervical back: Normal range of motion and neck supple.  Lymphadenopathy:     Cervical: No cervical adenopathy.  Skin:    General: Skin is warm and dry.     Findings: No rash.  Neurological:     Mental Status: She is alert.     Cranial Nerves: No cranial nerve deficit.     Sensory: No sensory deficit.  Psychiatric:        Mood and Affect: Mood is not anxious or depressed.        Speech: Speech normal.        Behavior: Behavior normal. Behavior is cooperative.        Judgment: Judgment normal.       Results for orders placed or performed in visit on 08/27/22  IBC + Ferritin  Result Value Ref Range   Iron 137 42 - 145 ug/dL   Transferrin 161.0 960.4 - 360.0 mg/dL   Saturation Ratios 54.0 20.0 - 50.0 %   Ferritin 54.4 10.0 - 291.0 ng/mL   TIBC 385.0 250.0 - 450.0 mcg/dL  Lipid panel  Result Value Ref Range   Cholesterol 219 (H) 0 - 200 mg/dL   Triglycerides 981.1 0.0 - 149.0 mg/dL   HDL 91.47 >82.95 mg/dL   VLDL 62.1 0.0 - 30.8 mg/dL   LDL Cholesterol 657 (H) 0 - 99 mg/dL   Total CHOL/HDL Ratio 3    NonHDL 132.48   Comprehensive metabolic panel  Result Value Ref Range   Sodium 137 135 - 145 mEq/L   Potassium 3.9 3.5 - 5.1 mEq/L   Chloride 102 96 - 112 mEq/L   CO2 26 19 - 32 mEq/L   Glucose, Bld 84 70 - 99 mg/dL   BUN 12 6 - 23 mg/dL   Creatinine, Ser 8.46 0.40 - 1.20 mg/dL   Total Bilirubin 0.5 0.2 - 1.2 mg/dL   Alkaline Phosphatase 74 39 - 117 U/L   AST 17 0 - 37 U/L   ALT 11 0 -  35 U/L   Total Protein 7.4 6.0 - 8.3 g/dL   Albumin 4.3 3.5 - 5.2 g/dL   GFR 96.04 >54.09 mL/min    Calcium 9.3 8.4 - 10.5 mg/dL  CBC with Differential/Platelet  Result Value Ref Range   WBC 4.9 4.0 - 10.5 K/uL   RBC 3.96 3.87 - 5.11 Mil/uL   Hemoglobin 12.7 12.0 - 15.0 g/dL   HCT 81.1 91.4 - 78.2 %   MCV 95.9 78.0 - 100.0 fl   MCHC 33.4 30.0 - 36.0 g/dL   RDW 95.6 21.3 - 08.6 %   Platelets 277.0 150.0 - 400.0 K/uL   Neutrophils Relative % 54.6 43.0 - 77.0 %   Lymphocytes Relative 30.6 12.0 - 46.0 %   Monocytes Relative 6.1 3.0 - 12.0 %   Eosinophils Relative 8.2 (H) 0.0 - 5.0 %   Basophils Relative 0.5 0.0 - 3.0 %   Neutro Abs 2.7 1.4 - 7.7 K/uL   Lymphs Abs 1.5 0.7 - 4.0 K/uL   Monocytes Absolute 0.3 0.1 - 1.0 K/uL   Eosinophils Absolute 0.4 0.0 - 0.7 K/uL   Basophils Absolute 0.0 0.0 - 0.1 K/uL    This visit occurred during the SARS-CoV-2 public health emergency.  Safety protocols were in place, including screening questions prior to the visit, additional usage of staff PPE, and extensive cleaning of exam room while observing appropriate contact time as indicated for disinfecting solutions.   COVID 19 screen:  No recent travel or known exposure to COVID19 The patient denies respiratory symptoms of COVID 19 at this time. The importance of social distancing was discussed today.   Assessment and Plan The patient's preventative maintenance and recommended screening tests for an annual wellness exam were reviewed in full today. Brought up to date unless services declined.  Counselled on the importance of diet, exercise, and its role in overall health and mortality. The patient's FH and SH was reviewed, including their home life, tobacco status, and drug and alcohol status.   Vaccines: Uptodate Tdap,  flu at [facility] PAP/DVE: Sees Dr. Senaida Ores, GYN, last pap 2022  Mammogram : per GYN No early family history of breast cancer or colon cancer. Colon cancer screening:  11/2021 colonoscopy, recall 10 year, Dr. Lennie Muckle. HIV: refused   Problem List Items Addressed  This Visit     Acute pain of right shoulder    Has improved with NSAIDs (SE).. just now started PT.      Other Visit Diagnoses     Routine general medical examination at a health care facility    -  Primary       Kerby Nora, MD

## 2022-09-09 ENCOUNTER — Ambulatory Visit: Payer: BC Managed Care – PPO

## 2022-09-09 DIAGNOSIS — M25611 Stiffness of right shoulder, not elsewhere classified: Secondary | ICD-10-CM

## 2022-09-09 DIAGNOSIS — M25511 Pain in right shoulder: Secondary | ICD-10-CM | POA: Diagnosis not present

## 2022-09-09 DIAGNOSIS — R29898 Other symptoms and signs involving the musculoskeletal system: Secondary | ICD-10-CM

## 2022-09-09 DIAGNOSIS — M542 Cervicalgia: Secondary | ICD-10-CM

## 2022-09-09 NOTE — Therapy (Signed)
OUTPATIENT PHYSICAL THERAPY SHOULDER EVALUATION   Patient Name: Connie Rosales MRN: [id number] DOB:[birth year], 47 y.o., female Today's Date: 09/09/2022  END OF SESSION:  PT End of Session - 09/09/22 1306     Visit Number 3    Number of Visits 30    Date for PT Re-Evaluation 11/21/22    Authorization - Visit Number 3    Authorization - Number of Visits 30    Progress Note Due on Visit 10    PT Start Time 1304    PT Stop Time 1345    PT Time Calculation (min) 41 min    Activity Tolerance Patient tolerated treatment well    Behavior During Therapy [facility] for tasks assessed/performed               Past Medical History:  Diagnosis Date   History of dysplastic nevus 2013   right upper back spinal, left ant prox thigh near groin   History of dysplastic nevus 03/06/2020   L ant thigh, mild atypia   History of dysplastic nevus 03/06/2020   L post shoulder, moderate atypia   Past Surgical History:  Procedure Laterality Date   arhtroscopic meniscal repair  2003   ARTHROSCOPIC REPAIR ACL  2002   right    COLONOSCOPY WITH PROPOFOL N/A 11/26/2021   Procedure: COLONOSCOPY WITH PROPOFOL;  Surgeon: Toney Reil, MD;  Location: [facility] ENDOSCOPY;  Service: Gastroenterology;  Laterality: N/A;   Patient Active Problem List   Diagnosis Date Noted   Acute pain of right shoulder 07/23/2022   Acute otitis externa of right ear 05/10/2022   Colon cancer screening    Deviated nasal septum 11/09/2020   Epistaxis 07/25/2020   Family history of malignant melanoma of skin 02/04/2012   Iron deficiency anemia 10/16/2006   COMMON MIGRAINE 10/16/2006   ALLERGIC RHINITIS 10/16/2006    PCP: Excell Seltzer, MD   REFERRING PROVIDER: Excell Seltzer, MD   REFERRING DIAG: M25.511 (ICD-10-CM) - Acute pain of right shoulder   THERAPY DIAG:  Acute pain of right shoulder  Decreased ROM of right shoulder  Decreased ROM of neck  Neck pain  Rationale for Evaluation and Treatment:  Rehabilitation  ONSET DATE: 03/2022  SUBJECTIVE:                                                                                                                                                                                      SUBJECTIVE STATEMENT:  Pt reports she had a good weekend.  Pt denies any difficulty with cleaning the house, but did have some pain when she jumped with her kids.  Hand dominance: Left, but does everything  R handed other than writing.  PERTINENT HISTORY: Per MDL 47 y.o. female patient of [name], [name], MD presents with  right shoulder pain.    She reports new onset pain ion right shoulder ongoing x  5 months.  No specific known fall or injury. No change in activity.,  Noted first when sleeping on left side.. would wake up with shooting pain when sleeping on right side.   Occ pain with moving shoulder rapidly.  Now in last 4-5 weeks pain is increasing.. pain with putting on coat and  sweatshirt. Still fairly good ROM, but sharp pain with pushing open heavy door.    She is still doing yoga.    She has not used any OTC medication to treat.    No numbness, no weakness in arm, good ROM of neck.. pain not triggered   PAIN:  Are you having pain? Yes: NPRS scale: 2/10 Pain location: R neck and R shoulder Pain description: sore Aggravating factors: lying on the R side, reaching high and ER/IR rotation, getting dressed Relieving factors: pain medications once a day, sometimes not at all.  PRECAUTIONS: None  WEIGHT BEARING RESTRICTIONS: No  FALLS:  Has patient fallen in last 6 months? Yes. Number of falls 1, fell at a restaurant and caught herself on the bench  LIVING ENVIRONMENT: Lives with: lives with their family Lives in: House/apartment Stairs: Yes: Internal: 14 steps; on left going up and External: 4 steps; none Has following equipment at home: None  OCCUPATION: Professor at [employer]  PLOF: Independent  PATIENT GOALS: Increase ROM and reduce  pain  NEXT MD VISIT: 09/03/22  OBJECTIVE:   DIAGNOSTIC FINDINGS:  No imaging performed  PATIENT SURVEYS:  FOTO 63  COGNITION: Overall cognitive status: Within functional limits for tasks assessed     SENSATION: WFL  POSTURE: Good posture in sitting   CERVICAL ROM:   Active ROM A/PROM (deg) eval  Flexion 47 deg  Extension 30 deg  Right lateral flexion 33 deg  Left lateral flexion 43 deg  Right rotation 44 deg  Left rotation 55 deg   (Blank rows = not tested)   UPPER EXTREMITY ROM:   Active ROM Right eval Left eval  Shoulder flexion 105 deg 157 deg  Shoulder extension 116 deg 170 deg  Shoulder abduction    Shoulder adduction    Shoulder internal rotation    Shoulder external rotation    Elbow flexion    Elbow extension    Wrist flexion    Wrist extension    Wrist ulnar deviation    Wrist radial deviation    Wrist pronation    Wrist supination    (Blank rows = not tested)  UPPER EXTREMITY MMT:  MMT Right eval Left eval  Shoulder flexion 4 4+  Shoulder extension    Shoulder abduction 4- 4+  Shoulder adduction    Shoulder internal rotation    Shoulder external rotation    Middle trapezius    Lower trapezius    Elbow flexion    Elbow extension    Wrist flexion    Wrist extension    Wrist ulnar deviation    Wrist radial deviation    Wrist pronation    Wrist supination    Grip strength (lbs)    (Blank rows = not tested)  SHOULDER SPECIAL TESTS: Impingement tests: Neer impingement test: positive , Hawkins/Kennedy impingement test: negative, and Painful arc test: negative Rotator cuff assessment: Empty can test: positive , Full can test: positive ,  and Belly press test: positive  Biceps assessment: Yergason's test: negative and Speed's test: negative  JOINT MOBILITY TESTING:   Pt with significant joint mobility restrictions in AP and inferior glides of the R shoulder in supine position  PALPATION:   Pt with tenderness with palpation along  the UT's with more pain present on the R side.  Pt also noted tenderness along the supraspinatus and infraspinatus musculature of the R side.  TP's noted in region.   TODAY'S TREATMENT: DATE: 09/09/22   TherEx:  Standing pendulums with 7# DB, forward/backward, lateral, circular, 2x10 each direction Standing physioball wall rolls into forward flexion, 2x10 Standing abduction wall slides 2 x 10 Standing bilateral wall slides with serratus lift off 2 x 10 Wall pushups x 20 Dowel behind back shoulder extension with elbow flexion (rolling PVC pipe up back) x 20   Manual:  Supine PA's to the R shoulder, Grades II-III for pain modulation and improved ROM, 30 sec bouts x multiple attempts Supine inferior glides to the R shoulder, Grades II-III for pain modulation and improved ROM, 30 sec bouts x multiple attempts Prone AP's to the R shoulder, Grades II-III for pain modulation and improved ROM, 30 sec bouts x multiple attempts Supine shoulder distraction for pain reduction and improved joint spacing, 30 sec bouts    PATIENT EDUCATION: Education details: Pt educated throughout session about proper posture and technique with exercises. Improved exercise technique, movement at target joints, use of target muscles after min to mod verbal, visual, tactile cues Person educated: Patient Education method: Explanation, Demonstration, Tactile cues, Verbal cues, and Handouts Education comprehension: verbalized understanding, returned demonstration, and verbal cues required  HOME EXERCISE PROGRAM:  Access Code: [email or web address] URL: [email or web address] Date: 08/29/2022 Prepared by: Tomasa Hose  Exercises - Isometric Shoulder Flexion at Wall  - 1 x daily - 7 x weekly - 3 sets - 10 reps - Isometric Shoulder Extension at Wall  - 1 x daily - 7 x weekly - 3 sets - 10 reps - Isometric Shoulder Abduction at Wall  - 1 x daily - 7 x weekly - 3 sets - 10 reps - Isometric Shoulder Adduction  - 1 x  daily - 7 x weekly - 3 sets - 10 reps  ASSESSMENT:  CLINICAL IMPRESSION:  Pt responded well to the manual therapy approach and noted improvement in overall ROM.  Pt is making good progress with strengthening at end range as well.  Pt does still have some deficits in forward flexion and has inability to keep the elbow straight when performing the tasks, but otherwise is doing well.  Pt will continue to benefit from the manual therapy and therapeutic exercises moving forward in therapy.   Pt will continue to benefit from skilled therapy to address remaining deficits in order to improve overall QoL and return to PLOF.      OBJECTIVE IMPAIRMENTS: decreased ROM, decreased strength, and pain.   ACTIVITY LIMITATIONS: lifting, dressing, and reach over head  PARTICIPATION LIMITATIONS: occupation  PERSONAL FACTORS: Fitness, Profession, and Time since onset of injury/illness/exacerbation are also affecting patient's functional outcome.   REHAB POTENTIAL: Good  CLINICAL DECISION MAKING: Stable/uncomplicated  EVALUATION COMPLEXITY: Low   GOALS: Goals reviewed with patient? Yes  SHORT TERM GOALS: Target date: 09/26/2022  Pt will be independent with HEP in order to demonstrate increased ability to perform tasks related to occupation/hobbies. Baseline: Pt given HEP at IE. Goal status: INITIAL  LONG TERM GOALS: Target date: 11/21/2022  Patient will demonstrate improved function  as evidenced by a score of 72 on FOTO measure for full participation in activities at home and in the community. Baseline: FOTO: 63 Goal status: INITIAL  2.  Pt will improve R shoulder ROM to be WFL (120 deg flexion/130 deg abduction) Baseline: R shoulder flexion: 105 deg; abduction 116 deg Goal status: INITIAL  3.  Pt will be able to donn clothing in morning with self reported 0/10 pain. Baseline: 2/10 pain at rest. Goal status: INITIAL  4.  Pt will improve cervical rotation ROM to be WFL (80 deg) in order to  demonstrate improved ability to drive and look at blind spots.   Baseline: R/L Cervical Rotation: 44 deg/55 deg Goal status: INITIAL  PLAN:  PT FREQUENCY: 2x/week  PT DURATION: 12 weeks  PLANNED INTERVENTIONS: Therapeutic exercises, Therapeutic activity, Neuromuscular re-education, Balance training, Gait training, Patient/Family education, Self Care, Joint mobilization, Joint manipulation, Vestibular training, Canalith repositioning, Dry Needling, Spinal manipulation, Spinal mobilization, Moist heat, Manual therapy, and Re-evaluation  PLAN FOR NEXT SESSION:  Assess neck for potential dry needling, heavy manual therapy within tolerance for shoulder mobility   Nolon Bussing, PT, DPT Physical Therapist - [facility]  [facility]  09/09/22, 5:30 PM

## 2022-09-12 ENCOUNTER — Ambulatory Visit: Payer: BC Managed Care – PPO

## 2022-09-12 DIAGNOSIS — M25511 Pain in right shoulder: Secondary | ICD-10-CM | POA: Diagnosis not present

## 2022-09-12 DIAGNOSIS — M542 Cervicalgia: Secondary | ICD-10-CM | POA: Diagnosis not present

## 2022-09-12 DIAGNOSIS — R29898 Other symptoms and signs involving the musculoskeletal system: Secondary | ICD-10-CM

## 2022-09-12 DIAGNOSIS — M25611 Stiffness of right shoulder, not elsewhere classified: Secondary | ICD-10-CM

## 2022-09-12 NOTE — Therapy (Signed)
OUTPATIENT PHYSICAL THERAPY SHOULDER EVALUATION   Patient Name: Connie Rosales MRN: [id number] DOB:1975-12-06, 47 y.o., female Today's Date: 09/12/2022  END OF SESSION:  PT End of Session - 09/12/22 1353     Visit Number 4    Number of Visits 30    Date for PT Re-Evaluation 11/21/22    Authorization - Visit Number 4    Authorization - Number of Visits 30    Progress Note Due on Visit 10    PT Start Time 1349    PT Stop Time 1430    PT Time Calculation (min) 41 min    Activity Tolerance Patient tolerated treatment well    Behavior During Therapy [facility] for tasks assessed/performed               Past Medical History:  Diagnosis Date   History of dysplastic nevus 2013   right upper back spinal, left ant prox thigh near groin   History of dysplastic nevus 03/06/2020   L ant thigh, mild atypia   History of dysplastic nevus 03/06/2020   L post shoulder, moderate atypia   Past Surgical History:  Procedure Laterality Date   arhtroscopic meniscal repair  2003   ARTHROSCOPIC REPAIR ACL  2002   right    COLONOSCOPY WITH PROPOFOL N/A 11/26/2021   Procedure: COLONOSCOPY WITH PROPOFOL;  Surgeon: Toney Reil, MD;  Location: [facility] ENDOSCOPY;  Service: Gastroenterology;  Laterality: N/A;   Patient Active Problem List   Diagnosis Date Noted   Acute pain of right shoulder 07/23/2022   Acute otitis externa of right ear 05/10/2022   Colon cancer screening    Deviated nasal septum 11/09/2020   Epistaxis 07/25/2020   Family history of malignant melanoma of skin 02/04/2012   Iron deficiency anemia 10/16/2006   COMMON MIGRAINE 10/16/2006   ALLERGIC RHINITIS 10/16/2006    PCP: Excell Seltzer, MD   REFERRING PROVIDER: Excell Seltzer, MD   REFERRING DIAG: M25.511 (ICD-10-CM) - Acute pain of right shoulder   THERAPY DIAG:  Acute pain of right shoulder  Decreased ROM of right shoulder  Decreased ROM of neck  Neck pain  Rationale for Evaluation and Treatment:  Rehabilitation  ONSET DATE: 03/2022  SUBJECTIVE:                                                                                                                                                                                      SUBJECTIVE STATEMENT:  Pt reports she feels as though she is doing better and is able to achieve greater ROM when reaching into the cabinets.  Pt still reports 5/10 pain when performing movements that are painful (  like getting dressed).   Hand dominance: Left, but does everything R handed other than writing.  PERTINENT HISTORY: Per MDL 47 y.o. female patient of [name], [name], MD presents with  right shoulder pain.    She reports new onset pain ion right shoulder ongoing x  5 months.  No specific known fall or injury. No change in activity.,  Noted first when sleeping on left side.. would wake up with shooting pain when sleeping on right side.   Occ pain with moving shoulder rapidly.  Now in last 4-5 weeks pain is increasing.. pain with putting on coat and  sweatshirt. Still fairly good ROM, but sharp pain with pushing open heavy door.    She is still doing yoga.    She has not used any OTC medication to treat.    No numbness, no weakness in arm, good ROM of neck.. pain not triggered   PAIN:  Are you having pain? Yes: NPRS scale: 0/10 Pain location: R neck and R shoulder Pain description: sore Aggravating factors: lying on the R side, reaching high and ER/IR rotation, getting dressed Relieving factors: pain medications once a day, sometimes not at all.  PRECAUTIONS: None  WEIGHT BEARING RESTRICTIONS: No  FALLS:  Has patient fallen in last 6 months? Yes. Number of falls 1, fell at a restaurant and caught herself on the bench  LIVING ENVIRONMENT: Lives with: lives with their family Lives in: House/apartment Stairs: Yes: Internal: 14 steps; on left going up and External: 4 steps; none Has following equipment at home:  None  OCCUPATION: Professor at [employer]  PLOF: Independent  PATIENT GOALS: Increase ROM and reduce pain  NEXT MD VISIT: 09/03/22  OBJECTIVE:   DIAGNOSTIC FINDINGS:  No imaging performed  PATIENT SURVEYS:  FOTO 63  COGNITION: Overall cognitive status: Within functional limits for tasks assessed     SENSATION: WFL  POSTURE: Good posture in sitting   CERVICAL ROM:   Active ROM A/PROM (deg) eval  Flexion 47 deg  Extension 30 deg  Right lateral flexion 33 deg  Left lateral flexion 43 deg  Right rotation 44 deg  Left rotation 55 deg   (Blank rows = not tested)   UPPER EXTREMITY ROM:   Active ROM Right eval Left eval  Shoulder flexion 105 deg 157 deg  Shoulder extension 116 deg 170 deg  Shoulder abduction    Shoulder adduction    Shoulder internal rotation    Shoulder external rotation    Elbow flexion    Elbow extension    Wrist flexion    Wrist extension    Wrist ulnar deviation    Wrist radial deviation    Wrist pronation    Wrist supination    (Blank rows = not tested)  UPPER EXTREMITY MMT:  MMT Right eval Left eval  Shoulder flexion 4 4+  Shoulder extension    Shoulder abduction 4- 4+  Shoulder adduction    Shoulder internal rotation    Shoulder external rotation    Middle trapezius    Lower trapezius    Elbow flexion    Elbow extension    Wrist flexion    Wrist extension    Wrist ulnar deviation    Wrist radial deviation    Wrist pronation    Wrist supination    Grip strength (lbs)    (Blank rows = not tested)  SHOULDER SPECIAL TESTS: Impingement tests: Neer impingement test: positive , Hawkins/Kennedy impingement test: negative, and Painful arc test: negative Rotator  cuff assessment: Empty can test: positive , Full can test: positive , and Belly press test: positive  Biceps assessment: Yergason's test: negative and Speed's test: negative  JOINT MOBILITY TESTING:   Pt with significant joint mobility restrictions in AP and inferior  glides of the R shoulder in supine position  PALPATION:   Pt with tenderness with palpation along the UT's with more pain present on the R side.  Pt also noted tenderness along the supraspinatus and infraspinatus musculature of the R side.  TP's noted in region.   TODAY'S TREATMENT: DATE: 09/12/22   TherEx:  UBE 2 min forward/2 min backwards, Lvl 4  Standing shoulder wall slides with serratus lift-off, 2x10 Standing arm circles with pysioball at wall, forward flexed 90 deg, CW/CCW 30 sec bouts x2 each direction Standing arm circles with pysioball at wall, abducted 90 deg, CW/CCW 30 sec bouts x2 each direction Standing arm circles with pysioball at wall, forward flexed 140 deg, CW/CCW 30 sec bouts x2 each direction Supine chest press, with PVC pipe and 7.5# AW attached, 2x10 Supine shoulder flexion above head and back to thighs, with PVC pipe and 7.5# AW attached, 2x10 Sidelying arm abduction with 4# DB, 2x10 Sidelying shoulder ER with towel under elbow, 4# DB, 2x10 Supine contract-relax technique applied for improved ROM of the R shoulder in IR/ER, 3-5 sec holds,    Manual:  Supine PA's to the R shoulder, Grades II-III for pain modulation and improved ROM, 30 sec bouts x multiple attempts Supine inferior glides to the R shoulder, Grades II-III for pain modulation and improved ROM, 30 sec bouts x multiple attempts Prone AP's to the R shoulder, Grades II-III for pain modulation and improved ROM, 30 sec bouts x multiple attempts Supine shoulder distraction for pain reduction and improved joint spacing, 30 sec bouts     PATIENT EDUCATION:  Education details: Pt educated throughout session about proper posture and technique with exercises. Improved exercise technique, movement at target joints, use of target muscles after min to mod verbal, visual, tactile cues Person educated: Patient Education method: Explanation, Demonstration, Tactile cues, Verbal cues, and Handouts Education  comprehension: verbalized understanding, returned demonstration, and verbal cues required  HOME EXERCISE PROGRAM:  Access Code: [email or web address] URL: [email or web address] Date: 08/29/2022 Prepared by: Tomasa Hose  Exercises - Isometric Shoulder Flexion at Wall  - 1 x daily - 7 x weekly - 3 sets - 10 reps - Isometric Shoulder Extension at Wall  - 1 x daily - 7 x weekly - 3 sets - 10 reps - Isometric Shoulder Abduction at Wall  - 1 x daily - 7 x weekly - 3 sets - 10 reps - Isometric Shoulder Adduction  - 1 x daily - 7 x weekly - 3 sets - 10 reps   ASSESSMENT:  CLINICAL IMPRESSION:  Pt continues to respond well to the exercises given and pt was given updated HEP in order to improve with more challenging exercises.  Pt noted to have improved IR of the R shoulder with the contract relax technique applied, however pt still lacks significant ER at this time.  Pt will continue to improve with manual therapy approaches and was encouraged to perform exercises at end range at home.       OBJECTIVE IMPAIRMENTS: decreased ROM, decreased strength, and pain.   ACTIVITY LIMITATIONS: lifting, dressing, and reach over head  PARTICIPATION LIMITATIONS: occupation  PERSONAL FACTORS: Fitness, Profession, and Time since onset of injury/illness/exacerbation are also affecting patient's functional outcome.   REHAB  POTENTIAL: Good  CLINICAL DECISION MAKING: Stable/uncomplicated  EVALUATION COMPLEXITY: Low   GOALS: Goals reviewed with patient? Yes  SHORT TERM GOALS: Target date: 09/26/2022  Pt will be independent with HEP in order to demonstrate increased ability to perform tasks related to occupation/hobbies. Baseline: Pt given HEP at IE. Goal status: INITIAL  LONG TERM GOALS: Target date: 11/21/2022  Patient will demonstrate improved function as evidenced by a score of 72 on FOTO measure for full participation in activities at home and in the community. Baseline: FOTO: 63 Goal status:  INITIAL  2.  Pt will improve R shoulder ROM to be WFL (120 deg flexion/130 deg abduction) Baseline: R shoulder flexion: 105 deg; abduction 116 deg Goal status: INITIAL  3.  Pt will be able to donn clothing in morning with self reported 0/10 pain. Baseline: 2/10 pain at rest. Goal status: INITIAL  4.  Pt will improve cervical rotation ROM to be WFL (80 deg) in order to demonstrate improved ability to drive and look at blind spots.   Baseline: R/L Cervical Rotation: 44 deg/55 deg Goal status: INITIAL  PLAN:  PT FREQUENCY: 2x/week  PT DURATION: 12 weeks  PLANNED INTERVENTIONS: Therapeutic exercises, Therapeutic activity, Neuromuscular re-education, Balance training, Gait training, Patient/Family education, Self Care, Joint mobilization, Joint manipulation, Vestibular training, Canalith repositioning, Dry Needling, Spinal manipulation, Spinal mobilization, Moist heat, Manual therapy, and Re-evaluation  PLAN FOR NEXT SESSION:   Assess neck for potential dry needling, heavy manual therapy within tolerance for shoulder mobility   Nolon Bussing, PT, DPT Physical Therapist - [facility]  [facility]  09/12/22, 4:13 PM

## 2022-09-16 ENCOUNTER — Ambulatory Visit: Payer: BC Managed Care – PPO

## 2022-09-19 ENCOUNTER — Ambulatory Visit: Payer: BC Managed Care – PPO | Attending: Family Medicine

## 2022-09-19 DIAGNOSIS — M25511 Pain in right shoulder: Secondary | ICD-10-CM | POA: Diagnosis not present

## 2022-09-19 DIAGNOSIS — M542 Cervicalgia: Secondary | ICD-10-CM | POA: Diagnosis not present

## 2022-09-19 DIAGNOSIS — M25611 Stiffness of right shoulder, not elsewhere classified: Secondary | ICD-10-CM | POA: Diagnosis not present

## 2022-09-19 DIAGNOSIS — R29898 Other symptoms and signs involving the musculoskeletal system: Secondary | ICD-10-CM | POA: Diagnosis not present

## 2022-09-19 NOTE — Therapy (Signed)
OUTPATIENT PHYSICAL THERAPY SHOULDER TREATMENT   Patient Name: Connie Rosales MRN: [id number] DOB:November 07, 1975, 47 y.o., female Today's Date: 09/19/2022  END OF SESSION:  PT End of Session - 09/19/22 1134     Visit Number 5    Number of Visits 30    Date for PT Re-Evaluation 11/21/22    Authorization - Number of Visits 30    Progress Note Due on Visit 10    PT Start Time 1134    PT Stop Time 1215    PT Time Calculation (min) 41 min    Activity Tolerance Patient tolerated treatment well    Behavior During Therapy [facility] for tasks assessed/performed                Past Medical History:  Diagnosis Date   History of dysplastic nevus 2013   right upper back spinal, left ant prox thigh near groin   History of dysplastic nevus 03/06/2020   L ant thigh, mild atypia   History of dysplastic nevus 03/06/2020   L post shoulder, moderate atypia   Past Surgical History:  Procedure Laterality Date   arhtroscopic meniscal repair  2003   ARTHROSCOPIC REPAIR ACL  2002   right    COLONOSCOPY WITH PROPOFOL N/A 11/26/2021   Procedure: COLONOSCOPY WITH PROPOFOL;  Surgeon: Toney Reil, MD;  Location: [facility] ENDOSCOPY;  Service: Gastroenterology;  Laterality: N/A;   Patient Active Problem List   Diagnosis Date Noted   Acute pain of right shoulder 07/23/2022   Acute otitis externa of right ear 05/10/2022   Colon cancer screening    Deviated nasal septum 11/09/2020   Epistaxis 07/25/2020   Family history of malignant melanoma of skin 02/04/2012   Iron deficiency anemia 10/16/2006   COMMON MIGRAINE 10/16/2006   ALLERGIC RHINITIS 10/16/2006    PCP: Excell Seltzer, MD   REFERRING PROVIDER: Excell Seltzer, MD   REFERRING DIAG: M25.511 (ICD-10-CM) - Acute pain of right shoulder   THERAPY DIAG:  Acute pain of right shoulder  Decreased ROM of right shoulder  Decreased ROM of neck  Neck pain  Rationale for Evaluation and Treatment: Rehabilitation  ONSET DATE:  03/2022  SUBJECTIVE:                                                                                                                                                                                      SUBJECTIVE STATEMENT:  Pt reports she is feeling better since having the Flu over the weekend.  Pt notes that she didn't exercise on Sunday but otherwise has been doing it every day.   Hand dominance: Left, but does everything R handed other  than writing.   PERTINENT HISTORY: Per MDL 47 y.o. female patient of [name], [name], MD presents with  right shoulder pain.    She reports new onset pain ion right shoulder ongoing x  5 months.  No specific known fall or injury. No change in activity.,  Noted first when sleeping on left side.. would wake up with shooting pain when sleeping on right side.   Occ pain with moving shoulder rapidly.  Now in last 4-5 weeks pain is increasing.. pain with putting on coat and  sweatshirt. Still fairly good ROM, but sharp pain with pushing open heavy door.    She is still doing yoga.    She has not used any OTC medication to treat.    No numbness, no weakness in arm, good ROM of neck.. pain not triggered   PAIN:  Are you having pain? Yes: NPRS scale: 0/10 Pain location: R neck and R shoulder Pain description: sore Aggravating factors: lying on the R side, reaching high and ER/IR rotation, getting dressed Relieving factors: pain medications once a day, sometimes not at all.  PRECAUTIONS: None  WEIGHT BEARING RESTRICTIONS: No  FALLS:  Has patient fallen in last 6 months? Yes. Number of falls 1, fell at a restaurant and caught herself on the bench  LIVING ENVIRONMENT: Lives with: lives with their family Lives in: House/apartment Stairs: Yes: Internal: 14 steps; on left going up and External: 4 steps; none Has following equipment at home: None  OCCUPATION: Professor at [employer]  PLOF: Independent  PATIENT GOALS: Increase ROM and reduce  pain  NEXT MD VISIT: 09/03/22  OBJECTIVE:   DIAGNOSTIC FINDINGS:  No imaging performed  PATIENT SURVEYS:  FOTO 63  COGNITION: Overall cognitive status: Within functional limits for tasks assessed     SENSATION: WFL  POSTURE: Good posture in sitting   CERVICAL ROM:   Active ROM A/PROM (deg) eval  Flexion 47 deg  Extension 30 deg  Right lateral flexion 33 deg  Left lateral flexion 43 deg  Right rotation 44 deg  Left rotation 55 deg   (Blank rows = not tested)   UPPER EXTREMITY ROM:   Active ROM Right eval Left eval Right 09/19/22  Shoulder flexion 105 deg 157 deg 132 deg  Shoulder abduction 116 deg 170 deg 125 deg  Shoulder adduction     Shoulder internal rotation     Shoulder external rotation     Elbow flexion     Elbow extension     Wrist flexion     Wrist extension     Wrist ulnar deviation     Wrist radial deviation     Wrist pronation     Wrist supination     (Blank rows = not tested)   UPPER EXTREMITY MMT:  MMT Right eval Left eval  Shoulder flexion 4 4+  Shoulder extension    Shoulder abduction 4- 4+  Shoulder adduction    Shoulder internal rotation    Shoulder external rotation    Middle trapezius    Lower trapezius    Elbow flexion    Elbow extension    Wrist flexion    Wrist extension    Wrist ulnar deviation    Wrist radial deviation    Wrist pronation    Wrist supination    Grip strength (lbs)    (Blank rows = not tested)  SHOULDER SPECIAL TESTS: Impingement tests: Neer impingement test: positive , Hawkins/Kennedy impingement test: negative, and Painful arc test: negative Rotator  cuff assessment: Empty can test: positive , Full can test: positive , and Belly press test: positive  Biceps assessment: Yergason's test: negative and Speed's test: negative   JOINT MOBILITY TESTING:   Pt with significant joint mobility restrictions in AP and inferior glides of the R shoulder in supine position   PALPATION:   Pt with  tenderness with palpation along the UT's with more pain present on the R side.  Pt also noted tenderness along the supraspinatus and infraspinatus musculature of the R side.  TP's noted in region.   TODAY'S TREATMENT: DATE: 09/19/22  TherEx:  Seated pulleys for increased forward flexion/abduction with pt controlled mobility, 2x10 each direction Seated lat pull downs, 15#, 2x10 with verbal and visual cuing for proper form. Seated rows at [facility], 25# with good technique, 2x10 Standing shoulder wall slides with serratus lift-off, 2x10    Manual:  Supine PA's to the R shoulder, Grades II-III for pain modulation and improved ROM, 30 sec bouts x multiple attempts Supine inferior glides to the R shoulder, Grades II-III for pain modulation and improved ROM, 30 sec bouts x multiple attempts Prone AP's to the R shoulder, Grades II-III for pain modulation and improved ROM, 30 sec bouts x multiple attempts Supine shoulder distraction for pain reduction and improved joint spacing, 30 sec bouts     PATIENT EDUCATION:  Education details: Pt educated throughout session about proper posture and technique with exercises. Improved exercise technique, movement at target joints, use of target muscles after min to mod verbal, visual, tactile cues Person educated: Patient Education method: Explanation, Demonstration, Tactile cues, Verbal cues, and Handouts Education comprehension: verbalized understanding, returned demonstration, and verbal cues required  HOME EXERCISE PROGRAM:  Access Code: [email or web address] URL: [email or web address] Date: 08/29/2022 Prepared by: Tomasa Hose  Exercises - Isometric Shoulder Flexion at Wall  - 1 x daily - 7 x weekly - 3 sets - 10 reps - Isometric Shoulder Extension at Wall  - 1 x daily - 7 x weekly - 3 sets - 10 reps - Isometric Shoulder Abduction at Wall  - 1 x daily - 7 x weekly - 3 sets - 10 reps - Isometric Shoulder Adduction  - 1 x daily - 7 x weekly -  3 sets - 10 reps   ASSESSMENT:  CLINICAL IMPRESSION:  Pt performed well with the exercises given and is demonstrating improved ROM as noted in the objective section following manual therapy and pulleys.  Pt advised to continue with current HEP and will benefit from continued strengthening at end range of motion.  Pt currently still having pain at end ranges, however when given the ability to control range herself (instead of PROM) pt is able to achieve greater ROM.   Pt will continue to benefit from skilled therapy to address remaining deficits in order to improve overall QoL and return to PLOF.       OBJECTIVE IMPAIRMENTS: decreased ROM, decreased strength, and pain.   ACTIVITY LIMITATIONS: lifting, dressing, and reach over head  PARTICIPATION LIMITATIONS: occupation  PERSONAL FACTORS: Fitness, Profession, and Time since onset of injury/illness/exacerbation are also affecting patient's functional outcome.   REHAB POTENTIAL: Good  CLINICAL DECISION MAKING: Stable/uncomplicated  EVALUATION COMPLEXITY: Low   GOALS: Goals reviewed with patient? Yes  SHORT TERM GOALS: Target date: 09/26/2022  Pt will be independent with HEP in order to demonstrate increased ability to perform tasks related to occupation/hobbies. Baseline: Pt given HEP at IE. Goal status: INITIAL  LONG TERM GOALS: Target date: 11/21/2022  Patient will demonstrate improved function as evidenced by a score of 72 on FOTO measure for full participation in activities at home and in the community. Baseline: FOTO: 63 Goal status: INITIAL  2.  Pt will improve R shoulder ROM to be WFL (120 deg flexion/130 deg abduction) Baseline: R shoulder flexion: 105 deg; abduction 116 deg Goal status: INITIAL  3.  Pt will be able to donn clothing in morning with self reported 0/10 pain. Baseline: 2/10 pain at rest. Goal status: INITIAL  4.  Pt will improve cervical rotation ROM to be WFL (80 deg) in order to demonstrate improved  ability to drive and look at blind spots.   Baseline: R/L Cervical Rotation: 44 deg/55 deg Goal status: INITIAL  PLAN:  PT FREQUENCY: 2x/week  PT DURATION: 12 weeks  PLANNED INTERVENTIONS: Therapeutic exercises, Therapeutic activity, Neuromuscular re-education, Balance training, Gait training, Patient/Family education, Self Care, Joint mobilization, Joint manipulation, Vestibular training, Canalith repositioning, Dry Needling, Spinal manipulation, Spinal mobilization, Moist heat, Manual therapy, and Re-evaluation  PLAN FOR NEXT SESSION:   Assess neck for potential dry needling, heavy manual therapy within tolerance for shoulder mobility, exercises at end range of motion   Nolon Bussing, PT, DPT Physical Therapist - [facility]  [facility]  09/19/22, 11:35 AM

## 2022-09-24 ENCOUNTER — Ambulatory Visit: Payer: BC Managed Care – PPO

## 2022-09-24 DIAGNOSIS — M25511 Pain in right shoulder: Secondary | ICD-10-CM | POA: Diagnosis not present

## 2022-09-24 DIAGNOSIS — R29898 Other symptoms and signs involving the musculoskeletal system: Secondary | ICD-10-CM

## 2022-09-24 DIAGNOSIS — M542 Cervicalgia: Secondary | ICD-10-CM

## 2022-09-24 DIAGNOSIS — M25611 Stiffness of right shoulder, not elsewhere classified: Secondary | ICD-10-CM | POA: Diagnosis not present

## 2022-09-24 NOTE — Therapy (Signed)
OUTPATIENT PHYSICAL THERAPY SHOULDER TREATMENT   Patient Name: Connie Rosales MRN: [id number] DOB:1975/08/07, 47 y.o., female Today's Date: 09/24/2022  END OF SESSION:  PT End of Session - 09/24/22 1122     Visit Number 6    Number of Visits 30    Date for PT Re-Evaluation 11/21/22    Authorization - Visit Number 6    Authorization - Number of Visits 30    Progress Note Due on Visit 10    PT Start Time 1121    PT Stop Time 1205    PT Time Calculation (min) 44 min    Activity Tolerance Patient tolerated treatment well    Behavior During Therapy [facility] for tasks assessed/performed              Past Medical History:  Diagnosis Date   History of dysplastic nevus 2013   right upper back spinal, left ant prox thigh near groin   History of dysplastic nevus 03/06/2020   L ant thigh, mild atypia   History of dysplastic nevus 03/06/2020   L post shoulder, moderate atypia   Past Surgical History:  Procedure Laterality Date   arhtroscopic meniscal repair  2003   ARTHROSCOPIC REPAIR ACL  2002   right    COLONOSCOPY WITH PROPOFOL N/A 11/26/2021   Procedure: COLONOSCOPY WITH PROPOFOL;  Surgeon: Toney Reil, MD;  Location: [facility] ENDOSCOPY;  Service: Gastroenterology;  Laterality: N/A;   Patient Active Problem List   Diagnosis Date Noted   Acute pain of right shoulder 07/23/2022   Acute otitis externa of right ear 05/10/2022   Colon cancer screening    Deviated nasal septum 11/09/2020   Epistaxis 07/25/2020   Family history of malignant melanoma of skin 02/04/2012   Iron deficiency anemia 10/16/2006   COMMON MIGRAINE 10/16/2006   ALLERGIC RHINITIS 10/16/2006    PCP: Excell Seltzer, MD   REFERRING PROVIDER: Excell Seltzer, MD   REFERRING DIAG: M25.511 (ICD-10-CM) - Acute pain of right shoulder   THERAPY DIAG:  Acute pain of right shoulder  Decreased ROM of right shoulder  Decreased ROM of neck  Neck pain  Rationale for Evaluation and Treatment:  Rehabilitation  ONSET DATE: 03/2022  SUBJECTIVE:                                                                                                                                                                                      SUBJECTIVE STATEMENT:  Pt reports she had a good weekend.  Pt notes no new complaints in the shoulder at this point in time.  Pt states she feels as though she is able to move arm better  in differing circumstances around the home.   Hand dominance: Left, but does everything R handed other than writing.   PERTINENT HISTORY: Per MDL 46 y.o. female patient of [name], [name], MD presents with  right shoulder pain.    She reports new onset pain ion right shoulder ongoing x  5 months.  No specific known fall or injury. No change in activity.,  Noted first when sleeping on left side.. would wake up with shooting pain when sleeping on right side.   Occ pain with moving shoulder rapidly.  Now in last 4-5 weeks pain is increasing.. pain with putting on coat and  sweatshirt. Still fairly good ROM, but sharp pain with pushing open heavy door.    She is still doing yoga.    She has not used any OTC medication to treat.    No numbness, no weakness in arm, good ROM of neck.. pain not triggered   PAIN:  Are you having pain? Yes: NPRS scale: 0/10 Pain location: R neck and R shoulder Pain description: sore Aggravating factors: lying on the R side, reaching high and ER/IR rotation, getting dressed Relieving factors: pain medications once a day, sometimes not at all.  PRECAUTIONS: None  WEIGHT BEARING RESTRICTIONS: No  FALLS:  Has patient fallen in last 6 months? Yes. Number of falls 1, fell at a restaurant and caught herself on the bench  LIVING ENVIRONMENT: Lives with: lives with their family Lives in: House/apartment Stairs: Yes: Internal: 14 steps; on left going up and External: 4 steps; none Has following equipment at home: None  OCCUPATION: Professor at  [employer]  PLOF: Independent  PATIENT GOALS: Increase ROM and reduce pain  NEXT MD VISIT: 09/03/22  OBJECTIVE:   DIAGNOSTIC FINDINGS:  No imaging performed  PATIENT SURVEYS:  FOTO 63  COGNITION: Overall cognitive status: Within functional limits for tasks assessed     SENSATION: WFL  POSTURE: Good posture in sitting   CERVICAL ROM:   Active ROM A/PROM (deg) eval  Flexion 47 deg  Extension 30 deg  Right lateral flexion 33 deg  Left lateral flexion 43 deg  Right rotation 44 deg  Left rotation 55 deg   (Blank rows = not tested)   UPPER EXTREMITY ROM:   Active ROM Right eval Left eval Right 09/19/22  Shoulder flexion 105 deg 157 deg 132 deg  Shoulder abduction 116 deg 170 deg 125 deg  Shoulder adduction     Shoulder internal rotation     Shoulder external rotation     Elbow flexion     Elbow extension     Wrist flexion     Wrist extension     Wrist ulnar deviation     Wrist radial deviation     Wrist pronation     Wrist supination     (Blank rows = not tested)   UPPER EXTREMITY MMT:  MMT Right eval Left eval  Shoulder flexion 4 4+  Shoulder extension    Shoulder abduction 4- 4+  Shoulder adduction    Shoulder internal rotation    Shoulder external rotation    Middle trapezius    Lower trapezius    Elbow flexion    Elbow extension    Wrist flexion    Wrist extension    Wrist ulnar deviation    Wrist radial deviation    Wrist pronation    Wrist supination    Grip strength (lbs)    (Blank rows = not tested)  SHOULDER SPECIAL TESTS:  Impingement tests: Neer impingement test: positive , Hawkins/Kennedy impingement test: negative, and Painful arc test: negative Rotator cuff assessment: Empty can test: positive , Full can test: positive , and Belly press test: positive  Biceps assessment: Yergason's test: negative and Speed's test: negative   JOINT MOBILITY TESTING:   Pt with significant joint mobility restrictions in AP and inferior glides of  the R shoulder in supine position   PALPATION:   Pt with tenderness with palpation along the UT's with more pain present on the R side.  Pt also noted tenderness along the supraspinatus and infraspinatus musculature of the R side.  TP's noted in region.   TODAY'S TREATMENT: DATE: 09/24/22  TherEx:  UBE, Level 4, 2.5 min forward/2.5 min backward Seated pulleys for increased forward flexion/abduction with pt controlled mobility, 2x10 each direction Seated lat pull downs, 15#, 2x10 with verbal and visual cuing for proper form. Seated rows at [facility], 25# with good technique, 2x10 Standing shoulder wall slides with serratus lift-off, 2x10 Standing red physioball wall rolls into forward flexion, 2x10    Manual:  Supine PA's to the R shoulder, Grades II-III for pain modulation and improved ROM, 30 sec bouts x multiple attempts Supine inferior glides to the R shoulder, Grades II-III for pain modulation and improved ROM, 30 sec bouts x multiple attempts Prone AP's to the R shoulder, Grades II-III for pain modulation and improved ROM, 30 sec bouts x multiple attempts Supine shoulder distraction for pain reduction and improved joint spacing, 30 sec bouts  Trigger Point Dry Needling (TDN), unbilled, 4 min Education performed with patient regarding potential benefit of TDN. Reviewed precautions and risks with patient. Reviewed special precautions/risks over lung fields which include pneumothorax. Reviewed signs and symptoms of pneumothorax and advised pt to go to ER immediately if these symptoms develop advise them of dry needling treatment. Extensive time spent with pt to ensure full understanding of TDN risks. Pt provided verbal consent to treatment. TDN performed to R deltoids and R biceps with 0.30 x 30 single needle placements with local twitch response (LTR). Pistoning technique utilized. Improved pain-free motion following intervention.     PATIENT EDUCATION:  Education details: Pt  educated throughout session about proper posture and technique with exercises. Improved exercise technique, movement at target joints, use of target muscles after min to mod verbal, visual, tactile cues Person educated: Patient Education method: Explanation, Demonstration, Tactile cues, Verbal cues, and Handouts Education comprehension: verbalized understanding, returned demonstration, and verbal cues required  HOME EXERCISE PROGRAM:  Access Code: [email or web address] URL: [email or web address] Date: 08/29/2022 Prepared by: Tomasa Hose  Exercises - Isometric Shoulder Flexion at Wall  - 1 x daily - 7 x weekly - 3 sets - 10 reps - Isometric Shoulder Extension at Wall  - 1 x daily - 7 x weekly - 3 sets - 10 reps - Isometric Shoulder Abduction at Wall  - 1 x daily - 7 x weekly - 3 sets - 10 reps - Isometric Shoulder Adduction  - 1 x daily - 7 x weekly - 3 sets - 10 reps   ASSESSMENT:  CLINICAL IMPRESSION:  Pt responded well to the manual therapy and exercises given.  Pt noted to have good response to the dry needling in order to relieve lingering TP's even after manual therapy approaches the past several visits.  Pt has one incident of bleeding, and pt reports she is prone to bruising, so she was advised that it would likely occur.  Pt agreeable and will be assessed  at the next visit to assess response to TDN.   Pt will continue to benefit from skilled therapy to address remaining deficits in order to improve overall QoL and return to PLOF.        OBJECTIVE IMPAIRMENTS: decreased ROM, decreased strength, and pain.   ACTIVITY LIMITATIONS: lifting, dressing, and reach over head  PARTICIPATION LIMITATIONS: occupation  PERSONAL FACTORS: Fitness, Profession, and Time since onset of injury/illness/exacerbation are also affecting patient's functional outcome.   REHAB POTENTIAL: Good  CLINICAL DECISION MAKING: Stable/uncomplicated  EVALUATION COMPLEXITY: Low   GOALS: Goals  reviewed with patient? Yes  SHORT TERM GOALS: Target date: 09/26/2022  Pt will be independent with HEP in order to demonstrate increased ability to perform tasks related to occupation/hobbies. Baseline: Pt given HEP at IE. Goal status: INITIAL  LONG TERM GOALS: Target date: 11/21/2022  Patient will demonstrate improved function as evidenced by a score of 72 on FOTO measure for full participation in activities at home and in the community. Baseline: FOTO: 63 Goal status: INITIAL  2.  Pt will improve R shoulder ROM to be WFL (120 deg flexion/130 deg abduction) Baseline: R shoulder flexion: 105 deg; abduction 116 deg Goal status: INITIAL  3.  Pt will be able to donn clothing in morning with self reported 0/10 pain. Baseline: 2/10 pain at rest. Goal status: INITIAL  4.  Pt will improve cervical rotation ROM to be WFL (80 deg) in order to demonstrate improved ability to drive and look at blind spots.   Baseline: R/L Cervical Rotation: 44 deg/55 deg Goal status: INITIAL  PLAN:  PT FREQUENCY: 2x/week  PT DURATION: 12 weeks  PLANNED INTERVENTIONS: Therapeutic exercises, Therapeutic activity, Neuromuscular re-education, Balance training, Gait training, Patient/Family education, Self Care, Joint mobilization, Joint manipulation, Vestibular training, Canalith repositioning, Dry Needling, Spinal manipulation, Spinal mobilization, Moist heat, Manual therapy, and Re-evaluation  PLAN FOR NEXT SESSION:   Assess neck for potential dry needling, heavy manual therapy within tolerance for shoulder mobility, exercises at end range of motion   Nolon Bussing, PT, DPT Physical Therapist - [facility], [facility]  [facility]  09/24/22, 12:59 PM

## 2022-09-26 ENCOUNTER — Ambulatory Visit: Payer: BC Managed Care – PPO

## 2022-09-26 DIAGNOSIS — M542 Cervicalgia: Secondary | ICD-10-CM | POA: Diagnosis not present

## 2022-09-26 DIAGNOSIS — J342 Deviated nasal septum: Secondary | ICD-10-CM | POA: Diagnosis not present

## 2022-09-26 DIAGNOSIS — M25611 Stiffness of right shoulder, not elsewhere classified: Secondary | ICD-10-CM | POA: Diagnosis not present

## 2022-09-26 DIAGNOSIS — M950 Acquired deformity of nose: Secondary | ICD-10-CM | POA: Diagnosis not present

## 2022-09-26 DIAGNOSIS — J3489 Other specified disorders of nose and nasal sinuses: Secondary | ICD-10-CM | POA: Diagnosis not present

## 2022-09-26 DIAGNOSIS — R29898 Other symptoms and signs involving the musculoskeletal system: Secondary | ICD-10-CM | POA: Diagnosis not present

## 2022-09-26 DIAGNOSIS — M25511 Pain in right shoulder: Secondary | ICD-10-CM | POA: Diagnosis not present

## 2022-09-26 NOTE — Therapy (Signed)
OUTPATIENT PHYSICAL THERAPY SHOULDER TREATMENT   Patient Name: Connie Rosales MRN: [id number] DOB:[birth year], 47 y.o., female Today's Date: 09/26/2022  END OF SESSION:  PT End of Session - 09/26/22 1605     Visit Number 7    Number of Visits 30    Date for PT Re-Evaluation 11/21/22    Authorization - Visit Number 7    Authorization - Number of Visits 30    Progress Note Due on Visit 10    PT Start Time 1605    PT Stop Time 1645    PT Time Calculation (min) 40 min    Activity Tolerance Patient tolerated treatment well    Behavior During Therapy [facility] for tasks assessed/performed               Past Medical History:  Diagnosis Date   History of dysplastic nevus 2013   right upper back spinal, left ant prox thigh near groin   History of dysplastic nevus 03/06/2020   L ant thigh, mild atypia   History of dysplastic nevus 03/06/2020   L post shoulder, moderate atypia   Past Surgical History:  Procedure Laterality Date   arhtroscopic meniscal repair  2003   ARTHROSCOPIC REPAIR ACL  2002   right    COLONOSCOPY WITH PROPOFOL N/A 11/26/2021   Procedure: COLONOSCOPY WITH PROPOFOL;  Surgeon: Toney Reil, MD;  Location: [facility] ENDOSCOPY;  Service: Gastroenterology;  Laterality: N/A;   Patient Active Problem List   Diagnosis Date Noted   Acute pain of right shoulder 07/23/2022   Acute otitis externa of right ear 05/10/2022   Colon cancer screening    Deviated nasal septum 11/09/2020   Epistaxis 07/25/2020   Family history of malignant melanoma of skin 02/04/2012   Iron deficiency anemia 10/16/2006   COMMON MIGRAINE 10/16/2006   ALLERGIC RHINITIS 10/16/2006    PCP: Excell Seltzer, MD   REFERRING PROVIDER: Excell Seltzer, MD   REFERRING DIAG: M25.511 (ICD-10-CM) - Acute pain of right shoulder   THERAPY DIAG:  Acute pain of right shoulder  Decreased ROM of right shoulder  Decreased ROM of neck  Neck pain  Rationale for Evaluation and Treatment:  Rehabilitation  ONSET DATE: 03/2022  SUBJECTIVE:                                                                                                                                                                                      SUBJECTIVE STATEMENT:  Pt reports no complications other than slight soreness/achey int he R shoulder after last session.  Pt unsure if that is from exercises or the DN.  Hand dominance: Left, but does everything R  handed other than writing.   PERTINENT HISTORY: Per MDL 48 y.o. female patient of [name], [name], MD presents with  right shoulder pain.    She reports new onset pain ion right shoulder ongoing x  5 months.  No specific known fall or injury. No change in activity.,  Noted first when sleeping on left side.. would wake up with shooting pain when sleeping on right side.   Occ pain with moving shoulder rapidly.  Now in last 4-5 weeks pain is increasing.. pain with putting on coat and  sweatshirt. Still fairly good ROM, but sharp pain with pushing open heavy door.    She is still doing yoga.    She has not used any OTC medication to treat.    No numbness, no weakness in arm, good ROM of neck.. pain not triggered   PAIN:  Are you having pain? Yes: NPRS scale: 0/10 Pain location: R neck and R shoulder Pain description: sore Aggravating factors: lying on the R side, reaching high and ER/IR rotation, getting dressed Relieving factors: pain medications once a day, sometimes not at all.  PRECAUTIONS: None  WEIGHT BEARING RESTRICTIONS: No  FALLS:  Has patient fallen in last 6 months? Yes. Number of falls 1, fell at a restaurant and caught herself on the bench  LIVING ENVIRONMENT: Lives with: lives with their family Lives in: House/apartment Stairs: Yes: Internal: 14 steps; on left going up and External: 4 steps; none Has following equipment at home: None  OCCUPATION: Professor at [employer]  PLOF: Independent  PATIENT GOALS: Increase ROM and  reduce pain  NEXT MD VISIT: 09/03/22  OBJECTIVE:   DIAGNOSTIC FINDINGS:  No imaging performed  PATIENT SURVEYS:  FOTO 63  COGNITION: Overall cognitive status: Within functional limits for tasks assessed     SENSATION: WFL  POSTURE: Good posture in sitting   CERVICAL ROM:   Active ROM A/PROM (deg) eval  Flexion 47 deg  Extension 30 deg  Right lateral flexion 33 deg  Left lateral flexion 43 deg  Right rotation 44 deg  Left rotation 55 deg   (Blank rows = not tested)    UPPER EXTREMITY ROM:   Active ROM Right eval Left eval Right 09/19/22  Shoulder flexion 105 deg 157 deg 132 deg  Shoulder abduction 116 deg 170 deg 125 deg  Shoulder adduction     Shoulder internal rotation     Shoulder external rotation     Elbow flexion     Elbow extension     Wrist flexion     Wrist extension     Wrist ulnar deviation     Wrist radial deviation     Wrist pronation     Wrist supination     (Blank rows = not tested)   UPPER EXTREMITY MMT:  MMT Right eval Left eval  Shoulder flexion 4 4+  Shoulder extension    Shoulder abduction 4- 4+  Shoulder adduction    Shoulder internal rotation    Shoulder external rotation    Middle trapezius    Lower trapezius    Elbow flexion    Elbow extension    Wrist flexion    Wrist extension    Wrist ulnar deviation    Wrist radial deviation    Wrist pronation    Wrist supination    Grip strength (lbs)    (Blank rows = not tested)  SHOULDER SPECIAL TESTS: Impingement tests: Neer impingement test: positive , Hawkins/Kennedy impingement test: negative, and Painful arc  test: negative Rotator cuff assessment: Empty can test: positive , Full can test: positive , and Belly press test: positive  Biceps assessment: Yergason's test: negative and Speed's test: negative   JOINT MOBILITY TESTING:   Pt with significant joint mobility restrictions in AP and inferior glides of the R shoulder in supine position   PALPATION:   Pt  with tenderness with palpation along the UT's with more pain present on the R side.  Pt also noted tenderness along the supraspinatus and infraspinatus musculature of the R side.  TP's noted in region.   TODAY'S TREATMENT: DATE: 09/26/22   TherEx:  Hooklying arms at 90/90 on foam roll for increased ER, 1# dumbbells utilized, 30 sec bouts Sidelying sleeper stretch for IR/ER, with overpressure applied by pt, 30 sec holds Prone Y's, 2x10 with therapist assisted into ROM due to weakness Prone I's, 2x10  Prone T's, 2x10 with  Prone W's, 2x10 with therapist assisted into ROM due to   UBE, Level 4, 2.5 min forward/2.5 min backward    Manual:  Supine PA's to the R shoulder, Grades II-III for pain modulation and improved ROM, 30 sec bouts x multiple attempts Supine inferior glides to the R shoulder, Grades II-III for pain modulation and improved ROM, 30 sec bouts x multiple attempts Prone AP's to the R shoulder, Grades II-III for pain modulation and improved ROM, 30 sec bouts x multiple attempts Supine shoulder distraction for pain reduction and improved joint spacing, 30 sec bouts    PATIENT EDUCATION:  Education details: Pt educated throughout session about proper posture and technique with exercises. Improved exercise technique, movement at target joints, use of target muscles after min to mod verbal, visual, tactile cues Person educated: Patient Education method: Explanation, Demonstration, Tactile cues, Verbal cues, and Handouts Education comprehension: verbalized understanding, returned demonstration, and verbal cues required  HOME EXERCISE PROGRAM:  Access Code: [email or web address] URL: [email or web address] Date: 08/29/2022 Prepared by: Tomasa Hose  Exercises - Isometric Shoulder Flexion at Wall  - 1 x daily - 7 x weekly - 3 sets - 10 reps - Isometric Shoulder Extension at Wall  - 1 x daily - 7 x weekly - 3 sets - 10 reps - Isometric Shoulder Abduction at Wall  - 1 x  daily - 7 x weekly - 3 sets - 10 reps - Isometric Shoulder Adduction  - 1 x daily - 7 x weekly - 3 sets - 10 reps   ASSESSMENT:  CLINICAL IMPRESSION:  Pt put forth great effort throughout the session.  Pt introduced to new exercises to challenge overall ROM and strengthening of the parascapular musculature.  Pt also advised and given updated HEP in order to improve overall ROM and strength while at home.  Pt will be introduced to TRX exercises at next visit for improved stabilization exercises going forward.   Pt will continue to benefit from skilled therapy to address remaining deficits in order to improve overall QoL and return to PLOF.       OBJECTIVE IMPAIRMENTS: decreased ROM, decreased strength, and pain.   ACTIVITY LIMITATIONS: lifting, dressing, and reach over head  PARTICIPATION LIMITATIONS: occupation  PERSONAL FACTORS: Fitness, Profession, and Time since onset of injury/illness/exacerbation are also affecting patient's functional outcome.   REHAB POTENTIAL: Good  CLINICAL DECISION MAKING: Stable/uncomplicated  EVALUATION COMPLEXITY: Low   GOALS: Goals reviewed with patient? Yes  SHORT TERM GOALS: Target date: 09/26/2022  Pt will be independent with HEP in order to demonstrate increased ability to perform tasks related to  occupation/hobbies. Baseline: Pt given HEP at IE. Goal status: INITIAL  LONG TERM GOALS: Target date: 11/21/2022  Patient will demonstrate improved function as evidenced by a score of 72 on FOTO measure for full participation in activities at home and in the community. Baseline: FOTO: 63 Goal status: INITIAL  2.  Pt will improve R shoulder ROM to be WFL (120 deg flexion/130 deg abduction) Baseline: R shoulder flexion: 105 deg; abduction 116 deg Goal status: INITIAL  3.  Pt will be able to donn clothing in morning with self reported 0/10 pain. Baseline: 2/10 pain at rest. Goal status: INITIAL  4.  Pt will improve cervical rotation ROM to be  WFL (80 deg) in order to demonstrate improved ability to drive and look at blind spots.   Baseline: R/L Cervical Rotation: 44 deg/55 deg Goal status: INITIAL  PLAN:  PT FREQUENCY: 2x/week  PT DURATION: 12 weeks  PLANNED INTERVENTIONS:  Therapeutic exercises, Therapeutic activity, Neuromuscular re-education, Balance training, Gait training, Patient/Family education, Self Care, Joint mobilization, Joint manipulation, Vestibular training, Canalith repositioning, Dry Needling, Spinal manipulation, Spinal mobilization, Moist heat, Manual therapy, and Re-evaluation  PLAN FOR NEXT SESSION:   Dry needling, scapular stabilization exercises on TRX, manual therapy and strengthening program continued.   Nolon Bussing, PT, DPT Physical Therapist - [facility]  09/26/22, 5:09 PM

## 2022-10-01 ENCOUNTER — Ambulatory Visit: Payer: BC Managed Care – PPO

## 2022-10-01 DIAGNOSIS — M25611 Stiffness of right shoulder, not elsewhere classified: Secondary | ICD-10-CM

## 2022-10-01 DIAGNOSIS — M25511 Pain in right shoulder: Secondary | ICD-10-CM

## 2022-10-01 DIAGNOSIS — M542 Cervicalgia: Secondary | ICD-10-CM | POA: Diagnosis not present

## 2022-10-01 DIAGNOSIS — R29898 Other symptoms and signs involving the musculoskeletal system: Secondary | ICD-10-CM

## 2022-10-01 NOTE — Therapy (Signed)
OUTPATIENT PHYSICAL THERAPY SHOULDER TREATMENT   Patient Name: Connie Rosales MRN: [id number] DOB:1975-10-19, 47 y.o., female Today's Date: 10/01/2022  END OF SESSION:  PT End of Session - 10/01/22 1355     Visit Number 8    Number of Visits 30    Date for PT Re-Evaluation 11/21/22    Authorization - Visit Number 8    Authorization - Number of Visits 30    Progress Note Due on Visit 10    PT Start Time 1350    PT Stop Time 1430    PT Time Calculation (min) 40 min    Activity Tolerance Patient tolerated treatment well    Behavior During Therapy [facility] for tasks assessed/performed               Past Medical History:  Diagnosis Date   History of dysplastic nevus 2013   right upper back spinal, left ant prox thigh near groin   History of dysplastic nevus 03/06/2020   L ant thigh, mild atypia   History of dysplastic nevus 03/06/2020   L post shoulder, moderate atypia   Past Surgical History:  Procedure Laterality Date   arhtroscopic meniscal repair  2003   ARTHROSCOPIC REPAIR ACL  2002   right    COLONOSCOPY WITH PROPOFOL N/A 11/26/2021   Procedure: COLONOSCOPY WITH PROPOFOL;  Surgeon: Toney Reil, MD;  Location: [facility] ENDOSCOPY;  Service: Gastroenterology;  Laterality: N/A;   Patient Active Problem List   Diagnosis Date Noted   Acute pain of right shoulder 07/23/2022   Acute otitis externa of right ear 05/10/2022   Colon cancer screening    Deviated nasal septum 11/09/2020   Epistaxis 07/25/2020   Family history of malignant melanoma of skin 02/04/2012   Iron deficiency anemia 10/16/2006   COMMON MIGRAINE 10/16/2006   ALLERGIC RHINITIS 10/16/2006    PCP: Excell Seltzer, MD   REFERRING PROVIDER: Excell Seltzer, MD   REFERRING DIAG: M25.511 (ICD-10-CM) - Acute pain of right shoulder   THERAPY DIAG:  Acute pain of right shoulder  Decreased ROM of right shoulder  Decreased ROM of neck  Neck pain  Rationale for Evaluation and Treatment:  Rehabilitation  ONSET DATE: 03/2022  SUBJECTIVE:                                                                                                                                                                                      SUBJECTIVE STATEMENT:  Pt reports she had a good Mother's Day.  Pt notes that she did not go to the air show due to the traffic concerns from previous day, but otherwise had a good day.  Hand dominance: Left, but does everything R handed other than writing.   PERTINENT HISTORY: Per MDL 47 y.o. female patient of [name], [name], MD presents with  right shoulder pain.    She reports new onset pain ion right shoulder ongoing x  5 months.  No specific known fall or injury. No change in activity.,  Noted first when sleeping on left side.. would wake up with shooting pain when sleeping on right side.   Occ pain with moving shoulder rapidly.  Now in last 4-5 weeks pain is increasing.. pain with putting on coat and  sweatshirt. Still fairly good ROM, but sharp pain with pushing open heavy door.    She is still doing yoga.    She has not used any OTC medication to treat.    No numbness, no weakness in arm, good ROM of neck.. pain not triggered   PAIN:  Are you having pain? Yes: NPRS scale: 0/10 Pain location: R neck and R shoulder Pain description: sore Aggravating factors: lying on the R side, reaching high and ER/IR rotation, getting dressed Relieving factors: pain medications once a day, sometimes not at all.  PRECAUTIONS: None  WEIGHT BEARING RESTRICTIONS: No  FALLS:  Has patient fallen in last 6 months? Yes. Number of falls 1, fell at a restaurant and caught herself on the bench  LIVING ENVIRONMENT: Lives with: lives with their family Lives in: House/apartment Stairs: Yes: Internal: 14 steps; on left going up and External: 4 steps; none Has following equipment at home: None  OCCUPATION: Professor at [employer]  PLOF: Independent  PATIENT GOALS:  Increase ROM and reduce pain  NEXT MD VISIT: 09/03/22  OBJECTIVE:   DIAGNOSTIC FINDINGS:  No imaging performed  PATIENT SURVEYS:  FOTO 63  COGNITION: Overall cognitive status: Within functional limits for tasks assessed     SENSATION: WFL  POSTURE: Good posture in sitting   CERVICAL ROM:   Active ROM A/PROM (deg) eval  Flexion 47 deg  Extension 30 deg  Right lateral flexion 33 deg  Left lateral flexion 43 deg  Right rotation 44 deg  Left rotation 55 deg   (Blank rows = not tested)    UPPER EXTREMITY ROM:   Active ROM Right eval Left eval Right 09/19/22  Shoulder flexion 105 deg 157 deg 132 deg  Shoulder abduction 116 deg 170 deg 125 deg  Shoulder adduction     Shoulder internal rotation     Shoulder external rotation     Elbow flexion     Elbow extension     Wrist flexion     Wrist extension     Wrist ulnar deviation     Wrist radial deviation     Wrist pronation     Wrist supination     (Blank rows = not tested)   UPPER EXTREMITY MMT:  MMT Right eval Left eval  Shoulder flexion 4 4+  Shoulder extension    Shoulder abduction 4- 4+  Shoulder adduction    Shoulder internal rotation    Shoulder external rotation    Middle trapezius    Lower trapezius    Elbow flexion    Elbow extension    Wrist flexion    Wrist extension    Wrist ulnar deviation    Wrist radial deviation    Wrist pronation    Wrist supination    Grip strength (lbs)    (Blank rows = not tested)  SHOULDER SPECIAL TESTS: Impingement tests: Neer impingement test: positive ,  Hawkins/Kennedy impingement test: negative, and Painful arc test: negative Rotator cuff assessment: Empty can test: positive , Full can test: positive , and Belly press test: positive  Biceps assessment: Yergason's test: negative and Speed's test: negative   JOINT MOBILITY TESTING:   Pt with significant joint mobility restrictions in AP and inferior glides of the R shoulder in supine  position   PALPATION:   Pt with tenderness with palpation along the UT's with more pain present on the R side.  Pt also noted tenderness along the supraspinatus and infraspinatus musculature of the R side.  TP's noted in region.   TODAY'S TREATMENT: DATE: 10/01/22   TherEx:  Hooklying arms at 90/90 on foam roll for increased ER, 1# dumbbells utilized, 30 sec bouts Sidelying sleeper stretch for IR/ER, with overpressure applied by pt, 30 sec holds Prone Y's, 2x10 Prone I's, 2x10  Prone T's, 2x10 Prone W's, 2x10 Standing body blade reps with B UE in order to promote GHJ stability and muscle activation of the RTC, 30 sec x multiple bouts  Manual:  Supine PA's to the R shoulder, Grades II-III for pain modulation and improved ROM, 30 sec bouts x multiple attempts Supine inferior glides to the R shoulder, Grades II-III for pain modulation and improved ROM, 30 sec bouts x multiple attempts Prone AP's to the R shoulder, Grades II-III for pain modulation and improved ROM, 30 sec bouts x multiple attempts Supine shoulder distraction for pain reduction and improved joint spacing, 30 sec bouts    PATIENT EDUCATION:  Education details: Pt educated throughout session about proper posture and technique with exercises. Improved exercise technique, movement at target joints, use of target muscles after min to mod verbal, visual, tactile cues Person educated: Patient Education method: Explanation, Demonstration, Tactile cues, Verbal cues, and Handouts Education comprehension: verbalized understanding, returned demonstration, and verbal cues required  HOME EXERCISE PROGRAM:  Access Code: [email or web address] URL: [email or web address] Date: 08/29/2022 Prepared by: Tomasa Hose  Exercises - Isometric Shoulder Flexion at Wall  - 1 x daily - 7 x weekly - 3 sets - 10 reps - Isometric Shoulder Extension at Wall  - 1 x daily - 7 x weekly - 3 sets - 10 reps - Isometric Shoulder Abduction at  Wall  - 1 x daily - 7 x weekly - 3 sets - 10 reps - Isometric Shoulder Adduction  - 1 x daily - 7 x weekly - 3 sets - 10 reps   ASSESSMENT:  CLINICAL IMPRESSION:  Pt is making significant improvement with ROM and strength with the prone exercises.  Pt was able to improve ability to perform the exercises and did not need assistance from therapist in order to lift off the mat.  Pt encouraged by improvements and will continue to improve with exercises and was advised to discontinue use of the isometrics as part of HEP. Pt has progressed to performing more dynamic exercises.     OBJECTIVE IMPAIRMENTS: decreased ROM, decreased strength, and pain.   ACTIVITY LIMITATIONS: lifting, dressing, and reach over head  PARTICIPATION LIMITATIONS: occupation  PERSONAL FACTORS: Fitness, Profession, and Time since onset of injury/illness/exacerbation are also affecting patient's functional outcome.   REHAB POTENTIAL: Good  CLINICAL DECISION MAKING: Stable/uncomplicated  EVALUATION COMPLEXITY: Low    GOALS:  Goals reviewed with patient? Yes  SHORT TERM GOALS: Target date: 09/26/2022  Pt will be independent with HEP in order to demonstrate increased ability to perform tasks related to occupation/hobbies. Baseline: Pt given HEP at IE. Goal status: INITIAL  LONG TERM GOALS: Target date: 11/21/2022  Patient will demonstrate improved function as evidenced by a score of 72 on FOTO measure for full participation in activities at home and in the community. Baseline: FOTO: 63 Goal status: INITIAL  2.  Pt will improve R shoulder ROM to be WFL (120 deg flexion/130 deg abduction) Baseline: R shoulder flexion: 105 deg; abduction 116 deg Goal status: INITIAL  3.  Pt will be able to donn clothing in morning with self reported 0/10 pain. Baseline: 2/10 pain at rest. Goal status: INITIAL  4.  Pt will improve cervical rotation ROM to be WFL (80 deg) in order to demonstrate improved ability to drive and  look at blind spots.   Baseline: R/L Cervical Rotation: 44 deg/55 deg Goal status: INITIAL  PLAN:  PT FREQUENCY: 2x/week  PT DURATION: 12 weeks  PLANNED INTERVENTIONS:  Therapeutic exercises, Therapeutic activity, Neuromuscular re-education, Balance training, Gait training, Patient/Family education, Self Care, Joint mobilization, Joint manipulation, Vestibular training, Canalith repositioning, Dry Needling, Spinal manipulation, Spinal mobilization, Moist heat, Manual therapy, and Re-evaluation  PLAN FOR NEXT SESSION:   Dry needling, scapular stabilization exercises on TRX, manual therapy and strengthening program continued.   Nolon Bussing, PT, DPT Physical Therapist - [facility]  10/01/22, 5:59 PM

## 2022-10-04 ENCOUNTER — Ambulatory Visit: Payer: BC Managed Care – PPO

## 2022-10-04 DIAGNOSIS — M25611 Stiffness of right shoulder, not elsewhere classified: Secondary | ICD-10-CM

## 2022-10-04 DIAGNOSIS — M25511 Pain in right shoulder: Secondary | ICD-10-CM | POA: Diagnosis not present

## 2022-10-04 DIAGNOSIS — M542 Cervicalgia: Secondary | ICD-10-CM

## 2022-10-04 DIAGNOSIS — R29898 Other symptoms and signs involving the musculoskeletal system: Secondary | ICD-10-CM

## 2022-10-04 NOTE — Therapy (Signed)
OUTPATIENT PHYSICAL THERAPY SHOULDER TREATMENT   Patient Name: Connie Rosales MRN: [id number] DOB:[birth year], 47 y.o., female Today's Date: 10/04/2022  END OF SESSION:  PT End of Session - 10/04/22 0947     Visit Number 9    Number of Visits 30    Date for PT Re-Evaluation 11/21/22    Authorization - Visit Number 9    Authorization - Number of Visits 30    Progress Note Due on Visit 10    PT Start Time 0947    PT Stop Time 1030    PT Time Calculation (min) 43 min    Activity Tolerance Patient tolerated treatment well    Behavior During Therapy [facility] for tasks assessed/performed               Past Medical History:  Diagnosis Date   History of dysplastic nevus 2013   right upper back spinal, left ant prox thigh near groin   History of dysplastic nevus 03/06/2020   L ant thigh, mild atypia   History of dysplastic nevus 03/06/2020   L post shoulder, moderate atypia   Past Surgical History:  Procedure Laterality Date   arhtroscopic meniscal repair  2003   ARTHROSCOPIC REPAIR ACL  2002   right    COLONOSCOPY WITH PROPOFOL N/A 11/26/2021   Procedure: COLONOSCOPY WITH PROPOFOL;  Surgeon: Toney Reil, MD;  Location: [facility] ENDOSCOPY;  Service: Gastroenterology;  Laterality: N/A;   Patient Active Problem List   Diagnosis Date Noted   Acute pain of right shoulder 07/23/2022   Acute otitis externa of right ear 05/10/2022   Colon cancer screening    Deviated nasal septum 11/09/2020   Epistaxis 07/25/2020   Family history of malignant melanoma of skin 02/04/2012   Iron deficiency anemia 10/16/2006   COMMON MIGRAINE 10/16/2006   ALLERGIC RHINITIS 10/16/2006    PCP: Excell Seltzer, MD   REFERRING PROVIDER: Excell Seltzer, MD   REFERRING DIAG: M25.511 (ICD-10-CM) - Acute pain of right shoulder   THERAPY DIAG:  Acute pain of right shoulder  Decreased ROM of right shoulder  Decreased ROM of neck  Neck pain  Rationale for Evaluation and Treatment:  Rehabilitation  ONSET DATE: 03/2022  SUBJECTIVE:                                                                                                                                                                                      SUBJECTIVE STATEMENT:  Pt reports no pain upon arrival.  Pt reports no new complaints and has taken some of the isometric exercises off of HEP due to progression and is more focused on stability exercises in  prone position.  Hand dominance: Left, but does everything R handed other than writing.   PERTINENT HISTORY: Per MDL 47 y.o. female patient of [name], [name], MD presents with  right shoulder pain.    She reports new onset pain ion right shoulder ongoing x  5 months.  No specific known fall or injury. No change in activity.,  Noted first when sleeping on left side.. would wake up with shooting pain when sleeping on right side.   Occ pain with moving shoulder rapidly.  Now in last 4-5 weeks pain is increasing.. pain with putting on coat and  sweatshirt. Still fairly good ROM, but sharp pain with pushing open heavy door.    She is still doing yoga.    She has not used any OTC medication to treat.    No numbness, no weakness in arm, good ROM of neck.. pain not triggered   PAIN:  Are you having pain? Yes: NPRS scale: 0/10 Pain location: R neck and R shoulder Pain description: sore Aggravating factors: lying on the R side, reaching high and ER/IR rotation, getting dressed Relieving factors: pain medications once a day, sometimes not at all.  PRECAUTIONS: None  WEIGHT BEARING RESTRICTIONS: No  FALLS:  Has patient fallen in last 6 months? Yes. Number of falls 1, fell at a restaurant and caught herself on the bench  LIVING ENVIRONMENT: Lives with: lives with their family Lives in: House/apartment Stairs: Yes: Internal: 14 steps; on left going up and External: 4 steps; none Has following equipment at home: None  OCCUPATION: Professor at  [employer]  PLOF: Independent  PATIENT GOALS: Increase ROM and reduce pain  NEXT MD VISIT: 09/03/22  OBJECTIVE:   DIAGNOSTIC FINDINGS:  No imaging performed  PATIENT SURVEYS:  FOTO 63  COGNITION: Overall cognitive status: Within functional limits for tasks assessed     SENSATION: WFL  POSTURE: Good posture in sitting   CERVICAL ROM:   Active ROM A/PROM (deg) eval  Flexion 47 deg  Extension 30 deg  Right lateral flexion 33 deg  Left lateral flexion 43 deg  Right rotation 44 deg  Left rotation 55 deg   (Blank rows = not tested)    UPPER EXTREMITY ROM:   Active ROM Right eval Left eval Right 09/19/22  Shoulder flexion 105 deg 157 deg 132 deg  Shoulder abduction 116 deg 170 deg 125 deg  Shoulder adduction     Shoulder internal rotation     Shoulder external rotation     Elbow flexion     Elbow extension     Wrist flexion     Wrist extension     Wrist ulnar deviation     Wrist radial deviation     Wrist pronation     Wrist supination     (Blank rows = not tested)   UPPER EXTREMITY MMT:  MMT Right eval Left eval  Shoulder flexion 4 4+  Shoulder extension    Shoulder abduction 4- 4+  Shoulder adduction    Shoulder internal rotation    Shoulder external rotation    Middle trapezius    Lower trapezius    Elbow flexion    Elbow extension    Wrist flexion    Wrist extension    Wrist ulnar deviation    Wrist radial deviation    Wrist pronation    Wrist supination    Grip strength (lbs)    (Blank rows = not tested)  SHOULDER SPECIAL TESTS: Impingement tests: Neer impingement  test: positive , Hawkins/Kennedy impingement test: negative, and Painful arc test: negative Rotator cuff assessment: Empty can test: positive , Full can test: positive , and Belly press test: positive  Biceps assessment: Yergason's test: negative and Speed's test: negative   JOINT MOBILITY TESTING:   Pt with significant joint mobility restrictions in AP and inferior glides of  the R shoulder in supine position   PALPATION:   Pt with tenderness with palpation along the UT's with more pain present on the R side.  Pt also noted tenderness along the supraspinatus and infraspinatus musculature of the R side.  TP's noted in region.   TODAY'S TREATMENT: DATE: 10/04/22   TherEx:  Hooklying arms at 90/90 on foam roll for increased ER, 1# dumbbells utilized, 30 sec bouts Sidelying sleeper stretch for IR/ER, with overpressure applied by pt, 30 sec holds x3 Supine on half bolster with 2# dumbbells for increased ER, 30 sec bouts x3 Prone I's, 3x10  Prone T's, 3x10 Prone W's, 3x10 Standing scapular retraction pulls using body weight on TRX, 3x10 Standing push-ups utilizing body weight on TRX, 3x10     PATIENT EDUCATION:  Education details: Pt educated throughout session about proper posture and technique with exercises. Improved exercise technique, movement at target joints, use of target muscles after min to mod verbal, visual, tactile cues Person educated: Patient Education method: Explanation, Demonstration, Tactile cues, Verbal cues, and Handouts Education comprehension: verbalized understanding, returned demonstration, and verbal cues required  HOME EXERCISE PROGRAM:  Access Code: [email or web address] URL: [email or web address] Date: 08/29/2022 Prepared by: Tomasa Hose  Exercises - Isometric Shoulder Flexion at Wall  - 1 x daily - 7 x weekly - 3 sets - 10 reps - Isometric Shoulder Extension at Wall  - 1 x daily - 7 x weekly - 3 sets - 10 reps - Isometric Shoulder Abduction at Wall  - 1 x daily - 7 x weekly - 3 sets - 10 reps - Isometric Shoulder Adduction  - 1 x daily - 7 x weekly - 3 sets - 10 reps   ASSESSMENT:  CLINICAL IMPRESSION:  Pt is consistently able to manage increased resistance and reps and is demonstrating increased stability of the R shoulder.  ROM is still complicating full strength, however pt is improving on weekly basis.  Pt  able to perform stability challenging exercises on the TRX and will continue to be challenged with exercises that challenge stability and muscular strength into end ranges of current ROM.   Pt will continue to benefit from skilled therapy to address remaining deficits in order to improve overall QoL and return to PLOF.        OBJECTIVE IMPAIRMENTS: decreased ROM, decreased strength, and pain.   ACTIVITY LIMITATIONS: lifting, dressing, and reach over head  PARTICIPATION LIMITATIONS: occupation  PERSONAL FACTORS: Fitness, Profession, and Time since onset of injury/illness/exacerbation are also affecting patient's functional outcome.   REHAB POTENTIAL: Good  CLINICAL DECISION MAKING: Stable/uncomplicated  EVALUATION COMPLEXITY: Low    GOALS:  Goals reviewed with patient? Yes  SHORT TERM GOALS: Target date: 09/26/2022  Pt will be independent with HEP in order to demonstrate increased ability to perform tasks related to occupation/hobbies. Baseline: Pt given HEP at IE. Goal status: INITIAL   LONG TERM GOALS: Target date: 11/21/2022  Patient will demonstrate improved function as evidenced by a score of 72 on FOTO measure for full participation in activities at home and in the community. Baseline: FOTO: 63 Goal status: INITIAL  2.  Pt will improve R  shoulder ROM to be WFL (120 deg flexion/130 deg abduction) Baseline: R shoulder flexion: 105 deg; abduction 116 deg Goal status: INITIAL  3.  Pt will be able to donn clothing in morning with self reported 0/10 pain. Baseline: 2/10 pain at rest. Goal status: INITIAL  4.  Pt will improve cervical rotation ROM to be WFL (80 deg) in order to demonstrate improved ability to drive and look at blind spots.   Baseline: R/L Cervical Rotation: 44 deg/55 deg Goal status: INITIAL  PLAN:  PT FREQUENCY: 2x/week  PT DURATION: 12 weeks  PLANNED INTERVENTIONS:  Therapeutic exercises, Therapeutic activity, Neuromuscular re-education, Balance  training, Gait training, Patient/Family education, Self Care, Joint mobilization, Joint manipulation, Vestibular training, Canalith repositioning, Dry Needling, Spinal manipulation, Spinal mobilization, Moist heat, Manual therapy, and Re-evaluation  PLAN FOR NEXT SESSION:   Dry needling, scapular stabilization exercises on TRX, manual therapy and strengthening program continued.   Nolon Bussing, PT, DPT Physical Therapist - [facility]  10/04/22, 11:03 AM

## 2022-10-11 ENCOUNTER — Ambulatory Visit: Payer: BC Managed Care – PPO

## 2022-10-11 DIAGNOSIS — M25611 Stiffness of right shoulder, not elsewhere classified: Secondary | ICD-10-CM

## 2022-10-11 DIAGNOSIS — M542 Cervicalgia: Secondary | ICD-10-CM | POA: Diagnosis not present

## 2022-10-11 DIAGNOSIS — R29898 Other symptoms and signs involving the musculoskeletal system: Secondary | ICD-10-CM | POA: Diagnosis not present

## 2022-10-11 DIAGNOSIS — M25511 Pain in right shoulder: Secondary | ICD-10-CM

## 2022-10-11 NOTE — Therapy (Signed)
OUTPATIENT PHYSICAL THERAPY SHOULDER TREATMENT   Patient Name: Connie Rosales MRN: [id number] DOB:04/11/1976, 47 y.o., female Today's Date: 10/11/2022  END OF SESSION:  PT End of Session - 10/11/22 0953     Visit Number 10    Number of Visits 30    Date for PT Re-Evaluation 11/21/22    Authorization - Visit Number 10    Authorization - Number of Visits 30    Progress Note Due on Visit 10    PT Start Time 0953    PT Stop Time 1035    PT Time Calculation (min) 42 min    Activity Tolerance Patient tolerated treatment well    Behavior During Therapy [facility] for tasks assessed/performed                Past Medical History:  Diagnosis Date   History of dysplastic nevus 2013   right upper back spinal, left ant prox thigh near groin   History of dysplastic nevus 03/06/2020   L ant thigh, mild atypia   History of dysplastic nevus 03/06/2020   L post shoulder, moderate atypia   Past Surgical History:  Procedure Laterality Date   arhtroscopic meniscal repair  2003   ARTHROSCOPIC REPAIR ACL  2002   right    COLONOSCOPY WITH PROPOFOL N/A 11/26/2021   Procedure: COLONOSCOPY WITH PROPOFOL;  Surgeon: Toney Reil, MD;  Location: [facility] END[facility]COPY;  Service: Gastroenterology;  Laterality: N/A;   Patient Active Problem List   Diagnosis Date Noted   Acute pain of right shoulder 07/23/2022   Acute otitis externa of right ear 05/10/2022   Colon cancer screening    Deviated nasal septum 11/09/2020   Epistaxis 07/25/2020   Family history of malignant melanoma of skin 02/04/2012   Iron deficiency anemia 10/16/2006   COMMON MIGRAINE 10/16/2006   ALLERGIC RHINITIS 10/16/2006    PCP: Excell Seltzer, MD   REFERRING PROVIDER: Excell Seltzer, MD   REFERRING DIAG: M25.511 (ICD-10-CM) - Acute pain of right shoulder   THERAPY DIAG:  Acute pain of right shoulder  Decreased ROM of right shoulder  Decreased ROM of neck  Neck pain  Rationale for Evaluation and Treatment:  Rehabilitation  ONSET DATE: 03/2022  SUBJECTIVE:                                                                                                                                                                                      SUBJECTIVE STATEMENT:  Pt is getting ready to going to [place] over the next week.  Pt busy packing and is hoping her husband can assist with the 50# lifting into upper cabinets.  Hand dominance:  Left, but does everything R handed other than writing.   PERTINENT HISTORY: Per MDL 47 y.o. female patient of [name], [name], MD presents with  right shoulder pain.    She reports new onset pain in right shoulder ongoing x  5 months.  No specific known fall or injury. No change in activity.,  Noted first when sleeping on left side.. would wake up with shooting pain when sleeping on right side.   Occ pain with moving shoulder rapidly.  Now in last 4-5 weeks pain is increasing.. pain with putting on coat and  sweatshirt. Still fairly good ROM, but sharp pain with pushing open heavy door.    She is still doing yoga.    She has not used any OTC medication to treat.    No numbness, no weakness in arm, good ROM of neck.. pain not triggered   PAIN:  Are you having pain? Yes: NPRS scale: 0/10 Pain location: R neck and R shoulder Pain description: sore Aggravating factors: lying on the R side, reaching high and ER/IR rotation, getting dressed Relieving factors: pain medications once a day, sometimes not at all.  PRECAUTIONS: None  WEIGHT BEARING RESTRICTIONS: No  FALLS:  Has patient fallen in last 6 months? Yes. Number of falls 1, fell at a restaurant and caught herself on the bench  LIVING ENVIRONMENT: Lives with: lives with their family Lives in: House/apartment Stairs: Yes: Internal: 14 steps; on left going up and External: 4 steps; none Has following equipment at home: None  OCCUPATION: Professor at [employer]  PLOF: Independent  PATIENT GOALS: Increase  ROM and reduce pain  NEXT MD VISIT: 09/03/22  OBJECTIVE:   DIAGNOSTIC FINDINGS:  No imaging performed  PATIENT SURVEYS:  FOTO 63  COGNITION: Overall cognitive status: Within functional limits for tasks assessed     SENSATION: WFL  POSTURE: Good posture in sitting   CERVICAL ROM:   Active ROM A/PROM (deg) eval  Flexion 47 deg  Extension 30 deg  Right lateral flexion 33 deg  Left lateral flexion 43 deg  Right rotation 44 deg  Left rotation 55 deg   (Blank rows = not tested)    UPPER EXTREMITY ROM:   Active ROM Right eval Left eval Right 09/19/22  Shoulder flexion 105 deg 157 deg 132 deg  Shoulder abduction 116 deg 170 deg 125 deg  Shoulder adduction     Shoulder internal rotation     Shoulder external rotation     Elbow flexion     Elbow extension     Wrist flexion     Wrist extension     Wrist ulnar deviation     Wrist radial deviation     Wrist pronation     Wrist supination     (Blank rows = not tested)   UPPER EXTREMITY MMT:  MMT Right eval Left eval  Shoulder flexion 4 4+  Shoulder extension    Shoulder abduction 4- 4+  Shoulder adduction    Shoulder internal rotation    Shoulder external rotation    Middle trapezius    Lower trapezius    Elbow flexion    Elbow extension    Wrist flexion    Wrist extension    Wrist ulnar deviation    Wrist radial deviation    Wrist pronation    Wrist supination    Grip strength (lbs)    (Blank rows = not tested)  SHOULDER SPECIAL TESTS: Impingement tests: Neer impingement test: positive , Hawkins/Kennedy impingement  test: negative, and Painful arc test: negative Rotator cuff assessment: Empty can test: positive , Full can test: positive , and Belly press test: positive  Biceps assessment: Yergason's test: negative and Speed's test: negative   JOINT MOBILITY TESTING:   Pt with significant joint mobility restrictions in AP and inferior glides of the R shoulder in supine position   PALPATION:    Pt with tenderness with palpation along the UT's with more pain present on the R side.  Pt also noted tenderness along the supraspinatus and infraspinatus musculature of the R side.  TP's noted in region.   TODAY'S TREATMENT: DATE: 10/11/22   TherEx:   Hooklying arms at 90/90 on foam roll for increased ER, 1# dumbbells utilized, 30 sec bouts Sidelying ER of the R shoulder, 4# dumbbells utilized, 2x10 Sidelying abduction of the R shoulder, 4# dumbbells utilized, 2x10  Standing overhead slams with 15# weight ball, 2x10 Standing wall circles with arm fully flexed into end range, CW/CCW, 30 sec bouts Standing wall circles with arm fully abducted into end range, CW/CCW, 30 sec bouts  POC discussion performed with goal assessment performed as noted below:      PATIENT EDUCATION:  Education details: Pt educated throughout session about proper posture and technique with exercises. Improved exercise technique, movement at target joints, use of target muscles after min to mod verbal, visual, tactile cues Person educated: Patient Education method: Explanation, Demonstration, Tactile cues, Verbal cues, and Handouts Education comprehension: verbalized understanding, returned demonstration, and verbal cues required  HOME EXERCISE PROGRAM:  Access Code: [email or web address] URL: [email or web address] Date: 08/29/2022 Prepared by: Tomasa Hose  Exercises - Isometric Shoulder Flexion at Wall  - 1 x daily - 7 x weekly - 3 sets - 10 reps - Isometric Shoulder Extension at Wall  - 1 x daily - 7 x weekly - 3 sets - 10 reps - Isometric Shoulder Abduction at Wall  - 1 x daily - 7 x weekly - 3 sets - 10 reps - Isometric Shoulder Adduction  - 1 x daily - 7 x weekly - 3 sets - 10 reps   ASSESSMENT:  CLINICAL IMPRESSION: Pt has continued to improve with ROM and is demonstrating much improved ROM as well as strength at this point in time.  Pt does still report slight increased pain with  performing overhead activities, such as getting dressed, so pt will continue with therapy in order to alleviate that pain felt.  Pt is also going on vacation for an extended amount of time and would like to assess how she responds to not having therapy for a prolonged period of time before discharging.  Patient's condition has the potential to improve in response to therapy. Maximum improvement is yet to be obtained. The anticipated improvement is attainable and reasonable in a generally predictable time.   Pt will continue to benefit from skilled therapy to address remaining deficits in order to improve overall QoL and return to PLOF.       OBJECTIVE IMPAIRMENTS: decreased ROM, decreased strength, and pain.   ACTIVITY LIMITATIONS: lifting, dressing, and reach over head  PARTICIPATION LIMITATIONS: occupation  PERSONAL FACTORS: Fitness, Profession, and Time since onset of injury/illness/exacerbation are also affecting patient's functional outcome.   REHAB POTENTIAL: Good  CLINICAL DECISION MAKING: Stable/uncomplicated  EVALUATION COMPLEXITY: Low    GOALS:  Goals reviewed with patient? Yes  SHORT TERM GOALS: Target date: 09/26/2022  Pt will be independent with HEP in order to demonstrate increased ability to perform tasks related to  occupation/hobbies. Baseline: Pt given HEP at IE. Goal status: INITIAL   LONG TERM GOALS: Target date: 11/21/2022  Patient will demonstrate improved function as evidenced by a score of 72 on FOTO measure for full participation in activities at home and in the community. Baseline: FOTO: 63 10/11/22: 72 Goal status: INITIAL  2.  Pt will improve R shoulder ROM to be WFL (120 deg flexion/130 deg abduction) Baseline: R shoulder flexion: 105 deg; abduction 116 deg 10/11/22: 144 deg; 153 deg Goal status: INITIAL  3.  Pt will be able to donn clothing in morning with self reported 0/10 pain. Baseline: 2/10 pain at rest. 10/11/22: 1/10 pain  Goal status:  INITIAL  4.  Pt will improve cervical rotation ROM to be WFL (80 deg) in order to demonstrate improved ability to drive and look at blind spots.   Baseline: R/L Cervical Rotation: 44 deg/55 deg 10/11/22: R/L Cervical Rotation: 68 deg/66 deg  Goal status: INITIAL  PLAN:  PT FREQUENCY: 2x/week  PT DURATION: 12 weeks  PLANNED INTERVENTIONS:  Therapeutic exercises, Therapeutic activity, Neuromuscular re-education, Balance training, Gait training, Patient/Family education, Self Care, Joint mobilization, Joint manipulation, Vestibular training, Canalith repositioning, Dry Needling, Spinal manipulation, Spinal mobilization, Moist heat, Manual therapy, and Re-evaluation  PLAN FOR NEXT SESSION:   Assess trip to [place] and ask about specifics of mechanics that are still bothering her.  Have pt attempt to don/dof clothing prior to next visit without modification to see if pain is still present.    Dry needling, scapular stabilization exercises on TRX, manual therapy and strengthening program continued.   Nolon Bussing, PT, DPT Physical Therapist - [facility]  10/11/22, 1:01 PM

## 2022-10-31 ENCOUNTER — Ambulatory Visit: Payer: BC Managed Care – PPO | Attending: Family Medicine

## 2022-10-31 DIAGNOSIS — M542 Cervicalgia: Secondary | ICD-10-CM | POA: Insufficient documentation

## 2022-10-31 DIAGNOSIS — R29898 Other symptoms and signs involving the musculoskeletal system: Secondary | ICD-10-CM | POA: Diagnosis not present

## 2022-10-31 DIAGNOSIS — M25511 Pain in right shoulder: Secondary | ICD-10-CM | POA: Diagnosis not present

## 2022-10-31 DIAGNOSIS — M25611 Stiffness of right shoulder, not elsewhere classified: Secondary | ICD-10-CM | POA: Insufficient documentation

## 2022-10-31 NOTE — Therapy (Signed)
OUTPATIENT PHYSICAL THERAPY SHOULDER TREATMENT   Patient Name: Connie Rosales MRN: [id number] DOB:05-Mar-1976, 47 y.o., female Today's Date: 10/31/2022  END OF SESSION:  PT End of Session - 10/31/22 1514     Visit Number 11    Number of Visits 30    Date for PT Re-Evaluation 11/21/22    Authorization - Visit Number 11    Authorization - Number of Visits 30    Progress Note Due on Visit 10    PT Start Time 1515    PT Stop Time 1600    PT Time Calculation (min) 45 min    Activity Tolerance Patient tolerated treatment well    Behavior During Therapy [facility] for tasks assessed/performed                Past Medical History:  Diagnosis Date   History of dysplastic nevus 2013   right upper back spinal, left ant prox thigh near groin   History of dysplastic nevus 03/06/2020   L ant thigh, mild atypia   History of dysplastic nevus 03/06/2020   L post shoulder, moderate atypia   Past Surgical History:  Procedure Laterality Date   arhtroscopic meniscal repair  2003   ARTHROSCOPIC REPAIR ACL  2002   right    COLONOSCOPY WITH PROPOFOL N/A 11/26/2021   Procedure: COLONOSCOPY WITH PROPOFOL;  Surgeon: Toney Reil, MD;  Location: [facility] ENDOSCOPY;  Service: Gastroenterology;  Laterality: N/A;   Patient Active Problem List   Diagnosis Date Noted   Acute pain of right shoulder 07/23/2022   Acute otitis externa of right ear 05/10/2022   Colon cancer screening    Deviated nasal septum 11/09/2020   Epistaxis 07/25/2020   Family history of malignant melanoma of skin 02/04/2012   Iron deficiency anemia 10/16/2006   COMMON MIGRAINE 10/16/2006   ALLERGIC RHINITIS 10/16/2006    PCP: Excell Seltzer, MD   REFERRING PROVIDER: Excell Seltzer, MD   REFERRING DIAG: M25.511 (ICD-10-CM) - Acute pain of right shoulder   THERAPY DIAG:  Acute pain of right shoulder  Decreased ROM of right shoulder  Decreased ROM of neck  Neck pain  Rationale for Evaluation and Treatment:  Rehabilitation  ONSET DATE: 03/2022  SUBJECTIVE:                                                                                                                                                                                      SUBJECTIVE STATEMENT:  Pt reports not performing her exercises during vacation. No R shoulder pain with donning/doffing clothes. Denies pain currently.   Hand dominance: Left, but does everything R handed other than writing.  PERTINENT HISTORY: Per MDL 47 y.o. female patient of [name], [name], MD presents with  right shoulder pain.    She reports new onset pain in right shoulder ongoing x  5 months.  No specific known fall or injury. No change in activity.,  Noted first when sleeping on left side.. would wake up with shooting pain when sleeping on right side.   Occ pain with moving shoulder rapidly.  Now in last 4-5 weeks pain is increasing.. pain with putting on coat and  sweatshirt. Still fairly good ROM, but sharp pain with pushing open heavy door.    She is still doing yoga.    She has not used any OTC medication to treat.    No numbness, no weakness in arm, good ROM of neck.. pain not triggered   PAIN:  Are you having pain? Yes: NPRS scale: 0/10 Pain location: R neck and R shoulder Pain description: sore Aggravating factors: lying on the R side, reaching high and ER/IR rotation, getting dressed Relieving factors: pain medications once a day, sometimes not at all.  PRECAUTIONS: None  WEIGHT BEARING RESTRICTIONS: No  FALLS:  Has patient fallen in last 6 months? Yes. Number of falls 1, fell at a restaurant and caught herself on the bench  LIVING ENVIRONMENT: Lives with: lives with their family Lives in: House/apartment Stairs: Yes: Internal: 14 steps; on left going up and External: 4 steps; none Has following equipment at home: None  OCCUPATION: Professor at [employer]  PLOF: Independent  PATIENT GOALS: Increase ROM and reduce  pain  NEXT MD VISIT: 09/03/22  OBJECTIVE:   DIAGNOSTIC FINDINGS:  No imaging performed  PATIENT SURVEYS:  FOTO 63  COGNITION: Overall cognitive status: Within functional limits for tasks assessed     SENSATION: WFL  POSTURE: Good posture in sitting   CERVICAL ROM:   Active ROM A/PROM (deg) eval  Flexion 47 deg  Extension 30 deg  Right lateral flexion 33 deg  Left lateral flexion 43 deg  Right rotation 44 deg  Left rotation 55 deg   (Blank rows = not tested)    UPPER EXTREMITY ROM:   Active ROM Right eval Left eval Right 09/19/22  Shoulder flexion 105 deg 157 deg 132 deg  Shoulder abduction 116 deg 170 deg 125 deg  Shoulder adduction     Shoulder internal rotation     Shoulder external rotation     Elbow flexion     Elbow extension     Wrist flexion     Wrist extension     Wrist ulnar deviation     Wrist radial deviation     Wrist pronation     Wrist supination     (Blank rows = not tested)   UPPER EXTREMITY MMT:  MMT Right eval Left eval  Shoulder flexion 4 4+  Shoulder extension    Shoulder abduction 4- 4+  Shoulder adduction    Shoulder internal rotation    Shoulder external rotation    Middle trapezius    Lower trapezius    Elbow flexion    Elbow extension    Wrist flexion    Wrist extension    Wrist ulnar deviation    Wrist radial deviation    Wrist pronation    Wrist supination    Grip strength (lbs)    (Blank rows = not tested)  SHOULDER SPECIAL TESTS: Impingement tests: Neer impingement test: positive , Hawkins/Kennedy impingement test: negative, and Painful arc test: negative Rotator cuff assessment: Empty  can test: positive , Full can test: positive , and Belly press test: positive  Biceps assessment: Yergason's test: negative and Speed's test: negative   JOINT MOBILITY TESTING:   Pt with significant joint mobility restrictions in AP and inferior glides of the R shoulder in supine position   PALPATION:   Pt with  tenderness with palpation along the UT's with more pain present on the R side.  Pt also noted tenderness along the supraspinatus and infraspinatus musculature of the R side.  TP's noted in region.   TODAY'S TREATMENT: DATE: 10/31/22   There.Ex:  Hooklying arms at 90/90 on foam roll for increased ER, 1# dumbbells utilized, 2x30 sec bouts  Sidelying ER of the R shoulder, 4# dumbbells utilized, 2x10  Sidelying abduction of the R shoulder, 4# dumbbells utilized, 2x10  Prone on physioball: 2x10 I's, T's and Y's. PT demo and min to mod VC's for form/technique.   Standing wall circles at end range shoulder flexion and abduction CW/CCW 2x30sec/plane   Standing TRX Rows: 3x8   Chest press on OMEGA: 3x8, 20#   Standing overhead slams with 15# weight ball, 2x10  R shoulder AROM post session:   Flexion: 152 deg RUE   Abduction: 125 deg RUE   PATIENT EDUCATION:  Education details: Pt educated throughout session about proper posture and technique with exercises. Improved exercise technique, movement at target joints, use of target muscles after min to mod verbal, visual, tactile cues Person educated: Patient Education method: Explanation, Demonstration, Tactile cues, Verbal cues, and Handouts Education comprehension: verbalized understanding, returned demonstration, and verbal cues required  HOME EXERCISE PROGRAM:  Access Code: [email or web address] URL: [email or web address] Date: 08/29/2022 Prepared by: Tomasa Hose  Exercises - Isometric Shoulder Flexion at Wall  - 1 x daily - 7 x weekly - 3 sets - 10 reps - Isometric Shoulder Extension at Wall  - 1 x daily - 7 x weekly - 3 sets - 10 reps - Isometric Shoulder Abduction at Wall  - 1 x daily - 7 x weekly - 3 sets - 10 reps - Isometric Shoulder Adduction  - 1 x daily - 7 x weekly - 3 sets - 10 reps   ASSESSMENT:  CLINICAL IMPRESSION: Pt arriving to PT after 2 week hiatus reporting subjectively very minimal R shoulder pain  over vacation. Pt with excellent R shoulder AROM and tolerance in volume of resisted exercise without exacerbation of pain. Pt still with mild pain at end range combined flexion/abduction/ER but overall happy with her progress. Educated pt to update author on tolerance with return to PT with discussion of possible discharge if pt feels ready pending goals completion. Pt understanding. Pt will continue to benefit from skilled therapy to address remaining deficits in order to improve overall QoL and return to PLOF.     OBJECTIVE IMPAIRMENTS: decreased ROM, decreased strength, and pain.   ACTIVITY LIMITATIONS: lifting, dressing, and reach over head  PARTICIPATION LIMITATIONS: occupation  PERSONAL FACTORS: Fitness, Profession, and Time since onset of injury/illness/exacerbation are also affecting patient's functional outcome.   REHAB POTENTIAL: Good  CLINICAL DECISION MAKING: Stable/uncomplicated  EVALUATION COMPLEXITY: Low    GOALS:  Goals reviewed with patient? Yes  SHORT TERM GOALS: Target date: 09/26/2022  Pt will be independent with HEP in order to demonstrate increased ability to perform tasks related to occupation/hobbies. Baseline: Pt given HEP at IE. Goal status: INITIAL   LONG TERM GOALS: Target date: 11/21/2022  Patient will demonstrate improved function as evidenced by a score of 72  on FOTO measure for full participation in activities at home and in the community. Baseline: FOTO: 63 10/11/22: 72 Goal status: INITIAL  2.  Pt will improve R shoulder ROM to be WFL (120 deg flexion/130 deg abduction) Baseline: R shoulder flexion: 105 deg; abduction 116 deg 10/11/22: 144 deg; 153 deg Goal status: INITIAL  3.  Pt will be able to donn clothing in morning with self reported 0/10 pain. Baseline: 2/10 pain at rest. 10/11/22: 1/10 pain  Goal status: INITIAL  4.  Pt will improve cervical rotation ROM to be WFL (80 deg) in order to demonstrate improved ability to drive and look at  blind spots.   Baseline: R/L Cervical Rotation: 44 deg/55 deg 10/11/22: R/L Cervical Rotation: 68 deg/66 deg  Goal status: INITIAL  PLAN:  PT FREQUENCY: 2x/week  PT DURATION: 12 weeks  PLANNED INTERVENTIONS:  Therapeutic exercises, Therapeutic activity, Neuromuscular re-education, Balance training, Gait training, Patient/Family education, Self Care, Joint mobilization, Joint manipulation, Vestibular training, Canalith repositioning, Dry Needling, Spinal manipulation, Spinal mobilization, Moist heat, Manual therapy, and Re-evaluation  PLAN FOR NEXT SESSION:  R shoulder AROM at end ranges and strength   Delphia Grates. Fairly IV, PT, DPT Physical Therapist- [facility]  10/31/22, 4:05 PM

## 2022-11-05 ENCOUNTER — Ambulatory Visit: Payer: BC Managed Care – PPO

## 2022-11-05 DIAGNOSIS — M25611 Stiffness of right shoulder, not elsewhere classified: Secondary | ICD-10-CM

## 2022-11-05 DIAGNOSIS — M25511 Pain in right shoulder: Secondary | ICD-10-CM | POA: Diagnosis not present

## 2022-11-05 DIAGNOSIS — R29898 Other symptoms and signs involving the musculoskeletal system: Secondary | ICD-10-CM | POA: Diagnosis not present

## 2022-11-05 DIAGNOSIS — M542 Cervicalgia: Secondary | ICD-10-CM | POA: Diagnosis not present

## 2022-11-05 NOTE — Therapy (Signed)
OUTPATIENT PHYSICAL THERAPY SHOULDER TREATMENT   Patient Name: Connie Rosales MRN: [id number] DOB:05-30-1975, 47 y.o., female Today's Date: 11/05/2022  END OF SESSION:  PT End of Session - 11/05/22 0956     Visit Number 12    Number of Visits 30    Date for PT Re-Evaluation 11/21/22    Authorization - Visit Number 12    Authorization - Number of Visits 30    Progress Note Due on Visit 10    PT Start Time 0948    PT Stop Time 1030    PT Time Calculation (min) 42 min    Activity Tolerance Patient tolerated treatment well    Behavior During Therapy [facility] for tasks assessed/performed                Past Medical History:  Diagnosis Date   History of dysplastic nevus 2013   right upper back spinal, left ant prox thigh near groin   History of dysplastic nevus 03/06/2020   L ant thigh, mild atypia   History of dysplastic nevus 03/06/2020   L post shoulder, moderate atypia   Past Surgical History:  Procedure Laterality Date   arhtroscopic meniscal repair  2003   ARTHROSCOPIC REPAIR ACL  2002   right    COLONOSCOPY WITH PROPOFOL N/A 11/26/2021   Procedure: COLONOSCOPY WITH PROPOFOL;  Surgeon: Toney Reil, MD;  Location: [facility] ENDOSCOPY;  Service: Gastroenterology;  Laterality: N/A;   Patient Active Problem List   Diagnosis Date Noted   Acute pain of right shoulder 07/23/2022   Acute otitis externa of right ear 05/10/2022   Colon cancer screening    Deviated nasal septum 11/09/2020   Epistaxis 07/25/2020   Family history of malignant melanoma of skin 02/04/2012   Iron deficiency anemia 10/16/2006   COMMON MIGRAINE 10/16/2006   ALLERGIC RHINITIS 10/16/2006    PCP: Excell Seltzer, MD   REFERRING PROVIDER: Excell Seltzer, MD   REFERRING DIAG: M25.511 (ICD-10-CM) - Acute pain of right shoulder   THERAPY DIAG:  Acute pain of right shoulder  Decreased ROM of right shoulder  Rationale for Evaluation and Treatment: Rehabilitation  ONSET DATE:  03/2022  SUBJECTIVE:                                                                                                                                                                                      SUBJECTIVE STATEMENT:  Pt denies pain. Shoulder pain still improved from last session with shoulder mobility and dressing ADL's.    Hand dominance: Left, but does everything R handed other than writing.   PERTINENT HISTORY: Per MDL 47 y.o. female patient of  [name], [name], MD presents with  right shoulder pain.    She reports new onset pain in right shoulder ongoing x  5 months.  No specific known fall or injury. No change in activity.,  Noted first when sleeping on left side.. would wake up with shooting pain when sleeping on right side.   Occ pain with moving shoulder rapidly.  Now in last 4-5 weeks pain is increasing.. pain with putting on coat and  sweatshirt. Still fairly good ROM, but sharp pain with pushing open heavy door.    She is still doing yoga.    She has not used any OTC medication to treat.    No numbness, no weakness in arm, good ROM of neck.. pain not triggered   PAIN:  Are you having pain? Yes: NPRS scale: 0/10 Pain location: R neck and R shoulder Pain description: sore Aggravating factors: lying on the R side, reaching high and ER/IR rotation, getting dressed Relieving factors: pain medications once a day, sometimes not at all.  PRECAUTIONS: None  WEIGHT BEARING RESTRICTIONS: No  FALLS:  Has patient fallen in last 6 months? Yes. Number of falls 1, fell at a restaurant and caught herself on the bench  LIVING ENVIRONMENT: Lives with: lives with their family Lives in: House/apartment Stairs: Yes: Internal: 14 steps; on left going up and External: 4 steps; none Has following equipment at home: None  OCCUPATION: Professor at [employer]  PLOF: Independent  PATIENT GOALS: Increase ROM and reduce pain  NEXT MD VISIT: 09/03/22  OBJECTIVE:   DIAGNOSTIC  FINDINGS:  No imaging performed  PATIENT SURVEYS:  FOTO 63  COGNITION: Overall cognitive status: Within functional limits for tasks assessed     SENSATION: WFL  POSTURE: Good posture in sitting   CERVICAL ROM:   Active ROM A/PROM (deg) eval  Flexion 47 deg  Extension 30 deg  Right lateral flexion 33 deg  Left lateral flexion 43 deg  Right rotation 44 deg  Left rotation 55 deg   (Blank rows = not tested)    UPPER EXTREMITY ROM:   Active ROM Right eval Left eval Right 09/19/22  Shoulder flexion 105 deg 157 deg 132 deg  Shoulder abduction 116 deg 170 deg 125 deg  Shoulder adduction     Shoulder internal rotation     Shoulder external rotation     Elbow flexion     Elbow extension     Wrist flexion     Wrist extension     Wrist ulnar deviation     Wrist radial deviation     Wrist pronation     Wrist supination     (Blank rows = not tested)   UPPER EXTREMITY MMT:  MMT Right eval Left eval  Shoulder flexion 4 4+  Shoulder extension    Shoulder abduction 4- 4+  Shoulder adduction    Shoulder internal rotation    Shoulder external rotation    Middle trapezius    Lower trapezius    Elbow flexion    Elbow extension    Wrist flexion    Wrist extension    Wrist ulnar deviation    Wrist radial deviation    Wrist pronation    Wrist supination    Grip strength (lbs)    (Blank rows = not tested)  SHOULDER SPECIAL TESTS: Impingement tests: Neer impingement test: positive , Hawkins/Kennedy impingement test: negative, and Painful arc test: negative Rotator cuff assessment: Empty can test: positive , Full can test: positive ,  and Belly press test: positive  Biceps assessment: Yergason's test: negative and Speed's test: negative   JOINT MOBILITY TESTING:   Pt with significant joint mobility restrictions in AP and inferior glides of the R shoulder in supine position   PALPATION:   Pt with tenderness with palpation along the UT's with more pain present  on the R side.  Pt also noted tenderness along the supraspinatus and infraspinatus musculature of the R side.  TP's noted in region.   TODAY'S TREATMENT: DATE: 11/05/22   There.Ex:  Hooklying arms at 90/90 on foam roll for increased ER, 3# dumbbells utilized, 2x30 sec bouts  Hook lying on foam roll AAROM shoulder flexion with dowel and 5# AW: x20   Prone:   I's, T's, Y's, and W's: 3x10, 3# on LUE, 1# on RUE. Good form/technique   Standing wall circles at end range shoulder flexion CW/CCW 2x30sec/plane with 2# AW on R wrist   Standing R shoulder flexion blue physioball slams for RTC stabilization: 3x8, 2# AW on R wrist  Standing TRX Rows: 3x8   Lat pull down on OMEGA: 2x6, 1x8 on 25#   Standing overhead slams with 15# weight ball, 2x10  R shoulder AROM post session:   Flexion: 156 deg RUE   Abduction: 122 deg RUE   PATIENT EDUCATION:  Education details: Pt educated throughout session about proper posture and technique with exercises. Improved exercise technique, movement at target joints, use of target muscles after min to mod verbal, visual, tactile cues Person educated: Patient Education method: Explanation, Demonstration, Tactile cues, Verbal cues, and Handouts Education comprehension: verbalized understanding, returned demonstration, and verbal cues required  HOME EXERCISE PROGRAM: Access Code: [email or web address] URL: [email or web address] Date: 11/05/2022 Prepared by: Ronnie Derby  Exercises - Prone Scapular Slide with Shoulder Extension  - 1 x daily - 7 x weekly - 3 sets - 10 reps - Prone Scapular Retraction Arms at Side  - 1 x daily - 7 x weekly - 3 sets - 10 reps - Prone Scapular Retraction Y  - 1 x daily - 7 x weekly - 3 sets - 10 reps - Prone W Scapular Retraction  - 1 x daily - 7 x weekly - 3 sets - 10 reps   Access Code: [email or web address] URL: [email or web address] Date: 08/29/2022 Prepared by: Tomasa Hose  Exercises - Isometric Shoulder  Flexion at Wall  - 1 x daily - 7 x weekly - 3 sets - 10 reps - Isometric Shoulder Extension at Wall  - 1 x daily - 7 x weekly - 3 sets - 10 reps - Isometric Shoulder Abduction at Wall  - 1 x daily - 7 x weekly - 3 sets - 10 reps - Isometric Shoulder Adduction  - 1 x daily - 7 x weekly - 3 sets - 10 reps   ASSESSMENT:  CLINICAL IMPRESSION: Updated pt's HEP with home resistance DB's. Planning for 3# DB's on LUE and 1# or can of soup/other available food item to progress RTC and periscapular strength. Pt remains limited in full overhead AROM and especially under resistance compared to non dominant arm. However pt is making steady gains from session to session in overhead flexion in standing. Updated HEP to progress shoulder strength and progress in mobility. Pt going on vacation and will return warranting d/c or recert in POC. Pt will continue to benefit from skilled therapy to address remaining deficits in order to improve overall QoL and return to PLOF.     OBJECTIVE IMPAIRMENTS: decreased ROM, decreased  strength, and pain.   ACTIVITY LIMITATIONS: lifting, dressing, and reach over head  PARTICIPATION LIMITATIONS: occupation  PERSONAL FACTORS: Fitness, Profession, and Time since onset of injury/illness/exacerbation are also affecting patient's functional outcome.   REHAB POTENTIAL: Good  CLINICAL DECISION MAKING: Stable/uncomplicated  EVALUATION COMPLEXITY: Low    GOALS:  Goals reviewed with patient? Yes  SHORT TERM GOALS: Target date: 09/26/2022  Pt will be independent with HEP in order to demonstrate increased ability to perform tasks related to occupation/hobbies. Baseline: Pt given HEP at IE. Goal status: INITIAL   LONG TERM GOALS: Target date: 11/21/2022  Patient will demonstrate improved function as evidenced by a score of 72 on FOTO measure for full participation in activities at home and in the community. Baseline: FOTO: 63 10/11/22: 72 Goal status: INITIAL  2.  Pt will  improve R shoulder ROM to be WFL (120 deg flexion/130 deg abduction) Baseline: R shoulder flexion: 105 deg; abduction 116 deg 10/11/22: 144 deg; 153 deg Goal status: INITIAL  3.  Pt will be able to donn clothing in morning with self reported 0/10 pain. Baseline: 2/10 pain at rest. 10/11/22: 1/10 pain  Goal status: INITIAL  4.  Pt will improve cervical rotation ROM to be WFL (80 deg) in order to demonstrate improved ability to drive and look at blind spots.   Baseline: R/L Cervical Rotation: 44 deg/55 deg 10/11/22: R/L Cervical Rotation: 68 deg/66 deg  Goal status: INITIAL  PLAN:  PT FREQUENCY: 2x/week  PT DURATION: 12 weeks  PLANNED INTERVENTIONS:  Therapeutic exercises, Therapeutic activity, Neuromuscular re-education, Balance training, Gait training, Patient/Family education, Self Care, Joint mobilization, Joint manipulation, Vestibular training, Canalith repositioning, Dry Needling, Spinal manipulation, Spinal mobilization, Moist heat, Manual therapy, and Re-evaluation  PLAN FOR NEXT SESSION:  R shoulder AROM at end ranges and strength   Delphia Grates. Fairly IV, PT, DPT Physical Therapist- [place]  [facility]  11/05/22, 11:05 AM

## 2023-02-17 DIAGNOSIS — Z01419 Encounter for gynecological examination (general) (routine) without abnormal findings: Secondary | ICD-10-CM | POA: Diagnosis not present

## 2023-02-17 DIAGNOSIS — Z1231 Encounter for screening mammogram for malignant neoplasm of breast: Secondary | ICD-10-CM | POA: Diagnosis not present

## 2023-02-17 DIAGNOSIS — Z3041 Encounter for surveillance of contraceptive pills: Secondary | ICD-10-CM | POA: Diagnosis not present

## 2023-02-17 DIAGNOSIS — Z130 Encounter for screening for diseases of the blood and blood-forming organs and certain disorders involving the immune mechanism: Secondary | ICD-10-CM | POA: Diagnosis not present

## 2023-02-24 ENCOUNTER — Other Ambulatory Visit: Payer: Self-pay | Admitting: Obstetrics and Gynecology

## 2023-02-24 DIAGNOSIS — R928 Other abnormal and inconclusive findings on diagnostic imaging of breast: Secondary | ICD-10-CM

## 2023-03-05 ENCOUNTER — Ambulatory Visit: Payer: BC Managed Care – PPO

## 2023-03-05 ENCOUNTER — Ambulatory Visit
Admission: RE | Admit: 2023-03-05 | Discharge: 2023-03-05 | Disposition: A | Discharge status: Home / Self Care | Payer: BC Managed Care – PPO | Source: Ambulatory Visit | Attending: Obstetrics and Gynecology | Admitting: Obstetrics and Gynecology

## 2023-03-05 DIAGNOSIS — R928 Other abnormal and inconclusive findings on diagnostic imaging of breast: Secondary | ICD-10-CM | POA: Diagnosis not present

## 2023-03-17 ENCOUNTER — Ambulatory Visit (INDEPENDENT_AMBULATORY_CARE_PROVIDER_SITE_OTHER): Payer: BC Managed Care – PPO | Admitting: Dermatology

## 2023-03-17 DIAGNOSIS — Q825 Congenital non-neoplastic nevus: Secondary | ICD-10-CM

## 2023-03-17 DIAGNOSIS — L821 Other seborrheic keratosis: Secondary | ICD-10-CM

## 2023-03-17 DIAGNOSIS — D2272 Melanocytic nevi of left lower limb, including hip: Secondary | ICD-10-CM

## 2023-03-17 DIAGNOSIS — Z86018 Personal history of other benign neoplasm: Secondary | ICD-10-CM

## 2023-03-17 DIAGNOSIS — Z1283 Encounter for screening for malignant neoplasm of skin: Secondary | ICD-10-CM

## 2023-03-17 DIAGNOSIS — L720 Epidermal cyst: Secondary | ICD-10-CM | POA: Diagnosis not present

## 2023-03-17 DIAGNOSIS — D2261 Melanocytic nevi of right upper limb, including shoulder: Secondary | ICD-10-CM

## 2023-03-17 DIAGNOSIS — D1801 Hemangioma of skin and subcutaneous tissue: Secondary | ICD-10-CM

## 2023-03-17 DIAGNOSIS — D225 Melanocytic nevi of trunk: Secondary | ICD-10-CM

## 2023-03-17 DIAGNOSIS — L738 Other specified follicular disorders: Secondary | ICD-10-CM

## 2023-03-17 DIAGNOSIS — W908XXA Exposure to other nonionizing radiation, initial encounter: Secondary | ICD-10-CM

## 2023-03-17 DIAGNOSIS — L578 Other skin changes due to chronic exposure to nonionizing radiation: Secondary | ICD-10-CM | POA: Diagnosis not present

## 2023-03-17 DIAGNOSIS — D2262 Melanocytic nevi of left upper limb, including shoulder: Secondary | ICD-10-CM

## 2023-03-17 DIAGNOSIS — L814 Other melanin hyperpigmentation: Secondary | ICD-10-CM

## 2023-03-17 DIAGNOSIS — D229 Melanocytic nevi, unspecified: Secondary | ICD-10-CM

## 2023-03-17 NOTE — Patient Instructions (Signed)

## 2023-03-17 NOTE — Progress Notes (Signed)
Follow-Up Visit   Subjective  Connie Rosales is a 47 y.o. female who presents for the following: Skin Cancer Screening and Full Body Skin Exam  Hx of dysplastic nevi  The patient presents for Total-Body Skin Exam (TBSE) for skin cancer screening and mole check. The patient has spots, moles and lesions to be evaluated, some may be new or changing and the patient may have concern these could be cancer.  The following portions of the chart were reviewed this encounter and updated as appropriate: medications, allergies, medical history  Review of Systems:  No other skin or systemic complaints except as noted in HPI or Assessment and Plan.  Objective  Well appearing patient in no apparent distress; mood and affect are within normal limits.  A full examination was performed including scalp, head, eyes, ears, nose, lips, neck, chest, axillae, abdomen, back, buttocks, bilateral upper extremities, bilateral lower extremities, hands, feet, fingers, toes, fingernails, and toenails. All findings within normal limits unless otherwise noted below.   Relevant physical exam findings are noted in the Assessment and Plan.    Assessment & Plan   SKIN CANCER SCREENING PERFORMED TODAY.  ACTINIC DAMAGE - Chronic condition, secondary to cumulative UV/sun exposure - diffuse scaly erythematous macules with underlying dyspigmentation - Recommend daily broad spectrum sunscreen SPF 30+ to sun-exposed areas, reapply every 2 hours as needed.  - Staying in the shade or wearing long sleeves, sun glasses (UVA+UVB protection) and wide brim hats (4-inch brim around the entire circumference of the hat) are also recommended for sun protection.  - Call for new or changing lesions.  LENTIGINES, SEBORRHEIC KERATOSES, HEMANGIOMAS - Benign normal skin lesions - Benign-appearing - Call for any changes  Sebaceous Hyperplasia face - Small yellow papules with a central dell - Benign-appearing - Observe. Call for  changes.  MILIA At face Exam: tiny erythematous firm white papule  Discussed this is a type of cyst. Benign-appearing.   Treatment Plan: Benign. Observe No recommended treatment   MELANOCYTIC NEVI - Tan-brown and/or pink-flesh-colored symmetric macules and papules - Benign appearing on exam today - Observation - Call clinic for new or changing moles - Recommend daily use of broad spectrum spf 30+ sunscreen to sun-exposed areas.   Nevus (4)  Left Anterior Thigh 2.5 mm slight irregular medium brown macule   Right Sternum 4.0 mm medium light brown macule   Left Flexor Forearm 2.5 mm regular medium brown macule   right medial scapula 4.0 mm med light brown speckled macule  Treatment Plan:  Benign-appearing, stable.  Observation.  Call clinic for new or changing moles.  Recommend daily use of broad spectrum spf 30+ sunscreen to sun-exposed areas.    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  Observation.  Call clinic for new or changing moles.  Recommend daily use of broad spectrum spf 30+ sunscreen to sun-exposed areas.   HISTORY OF DYSPLASTIC NEVUS Left anterior thigh mild and left post shoulder 2021 No evidence of recurrence today Recommend regular full body skin exams Recommend daily broad spectrum sunscreen SPF 30+ to sun-exposed areas, reapply every 2 hours as needed.  Call if any new or changing lesions are noted between office visits    Return in about 1 year (around 03/16/2024) for TBSE.  I, Asher Muir, CMA, am acting as scribe for [employer],  MD.   Documentation: I have reviewed the above documentation for accuracy and completeness, and I agree with the above.  Willeen Niece, MD

## 2023-08-08 ENCOUNTER — Telehealth: Payer: Self-pay | Admitting: *Deleted

## 2023-08-08 DIAGNOSIS — D509 Iron deficiency anemia, unspecified: Secondary | ICD-10-CM

## 2023-08-08 DIAGNOSIS — Z13220 Encounter for screening for lipoid disorders: Secondary | ICD-10-CM

## 2023-08-08 NOTE — Telephone Encounter (Signed)
-----   Message from Alvina Chou sent at 08/08/2023  2:35 PM EDT ----- Regarding: Lab orders for Fri, 4.10.25 Patient is scheduled for CPX labs, please order future labs, Thanks , Camelia Eng

## 2023-08-29 ENCOUNTER — Other Ambulatory Visit (INDEPENDENT_AMBULATORY_CARE_PROVIDER_SITE_OTHER): Payer: BC Managed Care – PPO

## 2023-08-29 ENCOUNTER — Encounter: Payer: Self-pay | Admitting: Family Medicine

## 2023-08-29 DIAGNOSIS — D509 Iron deficiency anemia, unspecified: Secondary | ICD-10-CM

## 2023-08-29 DIAGNOSIS — Z13220 Encounter for screening for lipoid disorders: Secondary | ICD-10-CM

## 2023-08-29 LAB — LIPID PANEL
Cholesterol: 215 mg/dL — ABNORMAL HIGH (ref 0–200)
HDL: 89.1 mg/dL (ref >39.00)
LDL Cholesterol: 102 mg/dL — ABNORMAL HIGH (ref 0–99)
NonHDL: 125.9
Total CHOL/HDL Ratio: 2
Triglycerides: 118 mg/dL (ref 0.0–149.0)
VLDL: 23.6 mg/dL (ref 0.0–40.0)

## 2023-08-29 LAB — COMPREHENSIVE METABOLIC PANEL WITH GFR
ALT: 14 U/L (ref 0–35)
AST: 16 U/L (ref 0–37)
Albumin: 4.6 g/dL (ref 3.5–5.2)
Alkaline Phosphatase: 81 U/L (ref 39–117)
BUN: 16 mg/dL (ref 6–23)
CO2: 28 meq/L (ref 19–32)
Calcium: 9.2 mg/dL (ref 8.4–10.5)
Chloride: 102 meq/L (ref 96–112)
Creatinine, Ser: 0.74 mg/dL (ref 0.40–1.20)
GFR: 95.79 mL/min (ref >60.00)
Glucose, Bld: 91 mg/dL (ref 70–99)
Potassium: 4.1 meq/L (ref 3.5–5.1)
Sodium: 136 meq/L (ref 135–145)
Total Bilirubin: 0.4 mg/dL (ref 0.2–1.2)
Total Protein: 7.1 g/dL (ref 6.0–8.3)

## 2023-08-29 LAB — CBC WITH DIFFERENTIAL/PLATELET
Basophils Absolute: 0 10*3/uL (ref 0.0–0.1)
Basophils Relative: 0.4 % (ref 0.0–3.0)
Eosinophils Absolute: 0.3 10*3/uL (ref 0.0–0.7)
Eosinophils Relative: 4.2 % (ref 0.0–5.0)
HCT: 37.8 % (ref 36.0–46.0)
Hemoglobin: 12.7 g/dL (ref 12.0–15.0)
Lymphocytes Relative: 32.3 % (ref 12.0–46.0)
Lymphs Abs: 2 10*3/uL (ref 0.7–4.0)
MCHC: 33.7 g/dL (ref 30.0–36.0)
MCV: 96.6 fl (ref 78.0–100.0)
Monocytes Absolute: 0.3 10*3/uL (ref 0.1–1.0)
Monocytes Relative: 5.2 % (ref 3.0–12.0)
Neutro Abs: 3.5 10*3/uL (ref 1.4–7.7)
Neutrophils Relative %: 57.9 % (ref 43.0–77.0)
Platelets: 289 10*3/uL (ref 150.0–400.0)
RBC: 3.92 Mil/uL (ref 3.87–5.11)
RDW: 12.7 % (ref 11.5–15.5)
WBC: 6.1 10*3/uL (ref 4.0–10.5)

## 2023-08-29 LAB — IBC + FERRITIN
Ferritin: 44.4 ng/mL (ref 10.0–291.0)
Iron: 174 ug/dL — ABNORMAL HIGH (ref 42–145)
Saturation Ratios: 42.4 % (ref 20.0–50.0)
TIBC: 410.2 ug/dL (ref 250.0–450.0)
Transferrin: 293 mg/dL (ref 212.0–360.0)

## 2023-08-29 NOTE — Progress Notes (Signed)
 No critical labs need to be addressed urgently. We will discuss labs in detail at upcoming office visit.

## 2023-09-05 ENCOUNTER — Encounter: Payer: BC Managed Care – PPO | Admitting: Family Medicine

## 2023-09-10 ENCOUNTER — Ambulatory Visit (INDEPENDENT_AMBULATORY_CARE_PROVIDER_SITE_OTHER): Payer: BC Managed Care – PPO | Admitting: Family Medicine

## 2023-09-10 ENCOUNTER — Encounter: Payer: Self-pay | Admitting: Family Medicine

## 2023-09-10 VITALS — BP 106/62 | HR 80 | Temp 99.1°F | Ht 61.77 in | Wt 117.0 lb

## 2023-09-10 DIAGNOSIS — Z0000 Encounter for general adult medical examination without abnormal findings: Secondary | ICD-10-CM

## 2023-09-10 DIAGNOSIS — Z1231 Encounter for screening mammogram for malignant neoplasm of breast: Secondary | ICD-10-CM

## 2023-09-10 DIAGNOSIS — Z789 Other specified health status: Secondary | ICD-10-CM

## 2023-09-10 NOTE — Progress Notes (Signed)
 Patient ID: Connie Rosales, female    DOB: 08/26/1975, 48 y.o.   MRN: [id number]  This visit was conducted in person.  BP 106/62   Pulse 80   Temp 99.1 F (37.3 C) (Oral)   Ht 5' 1.77" (1.569 m)   Wt 117 lb (53.1 kg)   SpO2 100%   BMI 21.56 kg/m    CC:  Chief Complaint  Patient presents with   Annual Exam   Mass    Located on right leg. Its under the skin can feel it but not see it     Subjective:   HPI: Connie Rosales is a 48 y.o. female presenting on 09/10/2023 for Annual Exam and Mass (Located on right leg. Its under the skin can feel it but not see it ) Reviewed labs in detail with patient.  Questions answered.    Has noted 6-9 months of bump on  right anterior thigh... no pain, no change in size.  Lab Results  Component Value Date   CHOL 215 (H) 08/29/2023   HDL 89.10 08/29/2023   LDLCALC 102 (H) 08/29/2023   LDLDIRECT 107.0 01/28/2012   TRIG 118.0 08/29/2023   CHOLHDL 2 08/29/2023   The 10-year ASCVD risk score (Arnett DK, et al., 2019) is: 0.4%   Values used to calculate the score:     Age: 55 years     Sex: Female     Is Non-Hispanic African American: No     Diabetic: No     Tobacco smoker: No     Systolic Blood Pressure: 106 mmHg     Is BP treated: No     HDL Cholesterol: 89.1 mg/dL     Total Cholesterol: 215 mg/dL  Common migraine: Doing well, rare occurrence on OCP.   Iron deficiency anemia:  resolved Hg 12.7, iron panel normal except slightly high total iron.  Diet: heart healthy diet... vegetarian Exercise: yoga twice a week. Occ running.   Pt refused to weigh today. Wt Readings from Last 3 Encounters:  09/10/23 117 lb (53.1 kg)  11/26/21 118 lb (53.5 kg)  08/31/21 120 lb (54.4 kg)   Body mass index is 21.56 kg/m.      Flowsheet Row Office Visit from 09/10/2023 in [facility]  PHQ-2 Total Score 0       Relevant past medical, surgical, family and social history reviewed and updated as  indicated. Interim medical history since our last visit reviewed. Allergies and medications reviewed and updated. Outpatient Medications Prior to Visit  Medication Sig Dispense Refill   LO LOESTRIN FE 1 MG-10 MCG / 10 MCG tablet Take 1 tablet by mouth daily.  11   Multiple Vitamin (MULTIVITAMIN) tablet Take 1 tablet by mouth daily.     No facility-administered medications prior to visit.     Per HPI unless specifically indicated in ROS section below Review of Systems  Constitutional:  Negative for fatigue and fever.  HENT:  Negative for congestion.   Eyes:  Negative for pain.  Respiratory:  Negative for cough and shortness of breath.   Cardiovascular:  Negative for chest pain, palpitations and leg swelling.  Gastrointestinal:  Negative for abdominal pain.  Genitourinary:  Negative for dysuria and vaginal bleeding.  Musculoskeletal:  Negative for back pain.  Neurological:  Negative for syncope, light-headedness and headaches.  Psychiatric/Behavioral:  Negative for dysphoric mood.    Objective:  BP 106/62   Pulse 80   Temp 99.1 F (  37.3 C) (Oral)   Ht 5' 1.77" (1.569 m)   Wt 117 lb (53.1 kg)   SpO2 100%   BMI 21.56 kg/m   Wt Readings from Last 3 Encounters:  09/10/23 117 lb (53.1 kg)  11/26/21 118 lb (53.5 kg)  08/31/21 120 lb (54.4 kg)      Physical Exam Vitals and nursing note reviewed.  Constitutional:      General: She is not in acute distress.    Appearance: Normal appearance. She is well-developed. She is not ill-appearing or toxic-appearing.  HENT:     Head: Normocephalic.     Right Ear: Hearing, tympanic membrane, ear canal and external ear normal.     Left Ear: Hearing, tympanic membrane, ear canal and external ear normal.     Nose: Nose normal.  Eyes:     General: Lids are normal. Lids are everted, no foreign bodies appreciated.     Conjunctiva/sclera: Conjunctivae normal.     Pupils: Pupils are equal, round, and reactive to light.  Neck:     Thyroid: No  thyroid mass or thyromegaly.     Vascular: No carotid bruit.     Trachea: Trachea normal.  Cardiovascular:     Rate and Rhythm: Normal rate and regular rhythm.     Heart sounds: Normal heart sounds, S1 normal and S2 normal. No murmur heard.    No gallop.  Pulmonary:     Effort: Pulmonary effort is normal. No respiratory distress.     Breath sounds: Normal breath sounds. No wheezing, rhonchi or rales.  Abdominal:     General: Bowel sounds are normal. There is no distension or abdominal bruit.     Palpations: Abdomen is soft. There is no fluid wave or mass.     Tenderness: There is no abdominal tenderness. There is no guarding or rebound.     Hernia: No hernia is present.  Musculoskeletal:     Cervical back: Normal range of motion and neck supple.     Comments: Small pea-sized mobile nontender cystlike structure in right dorsal thigh  Lymphadenopathy:     Cervical: No cervical adenopathy.  Skin:    General: Skin is warm and dry.     Findings: No rash.  Neurological:     Mental Status: She is alert.     Cranial Nerves: No cranial nerve deficit.     Sensory: No sensory deficit.  Psychiatric:        Mood and Affect: Mood is not anxious or depressed.        Speech: Speech normal.        Behavior: Behavior normal. Behavior is cooperative.        Judgment: Judgment normal.       Results for orders placed or performed in visit on 08/29/23  IBC + Ferritin   Collection Time: 08/29/23 11:01 AM  Result Value Ref Range   Iron 174 (H) 42 - 145 ug/dL   Transferrin 409.8 119.1 - 360.0 mg/dL   Saturation Ratios 47.8 20.0 - 50.0 %   Ferritin 44.4 10.0 - 291.0 ng/mL   TIBC 410.2 250.0 - 450.0 mcg/dL  CBC with Differential/Platelet   Collection Time: 08/29/23 11:01 AM  Result Value Ref Range   WBC 6.1 4.0 - 10.5 K/uL   RBC 3.92 3.87 - 5.11 Mil/uL   Hemoglobin 12.7 12.0 - 15.0 g/dL   HCT 29.5 62.1 - 30.8 %   MCV 96.6 78.0 - 100.0 fl   MCHC 33.7 30.0 - 36.0  g/dL   RDW 28.4 13.2 - 44.0 %    Platelets 289.0 150.0 - 400.0 K/uL   Neutrophils Relative % 57.9 43.0 - 77.0 %   Lymphocytes Relative 32.3 12.0 - 46.0 %   Monocytes Relative 5.2 3.0 - 12.0 %   Eosinophils Relative 4.2 0.0 - 5.0 %   Basophils Relative 0.4 0.0 - 3.0 %   Neutro Abs 3.5 1.4 - 7.7 K/uL   Lymphs Abs 2.0 0.7 - 4.0 K/uL   Monocytes Absolute 0.3 0.1 - 1.0 K/uL   Eosinophils Absolute 0.3 0.0 - 0.7 K/uL   Basophils Absolute 0.0 0.0 - 0.1 K/uL  Comprehensive metabolic panel   Collection Time: 08/29/23 11:01 AM  Result Value Ref Range   Sodium 136 135 - 145 mEq/L   Potassium 4.1 3.5 - 5.1 mEq/L   Chloride 102 96 - 112 mEq/L   CO2 28 19 - 32 mEq/L   Glucose, Bld 91 70 - 99 mg/dL   BUN 16 6 - 23 mg/dL   Creatinine, Ser 1.02 0.40 - 1.20 mg/dL   Total Bilirubin 0.4 0.2 - 1.2 mg/dL   Alkaline Phosphatase 81 39 - 117 U/L   AST 16 0 - 37 U/L   ALT 14 0 - 35 U/L   Total Protein 7.1 6.0 - 8.3 g/dL   Albumin 4.6 3.5 - 5.2 g/dL   GFR 72.53 >66.44 mL/min   Calcium 9.2 8.4 - 10.5 mg/dL  Lipid panel   Collection Time: 08/29/23 11:01 AM  Result Value Ref Range   Cholesterol 215 (H) 0 - 200 mg/dL   Triglycerides 034.7 0.0 - 149.0 mg/dL   HDL 42.59 >56.38 mg/dL   VLDL 75.6 0.0 - 43.3 mg/dL   LDL Cholesterol 295 (H) 0 - 99 mg/dL   Total CHOL/HDL Ratio 2    NonHDL 125.90     This visit occurred during the SARS-CoV-2 public health emergency.  Safety protocols were in place, including screening questions prior to the visit, additional usage of staff PPE, and extensive cleaning of exam room while observing appropriate contact time as indicated for disinfecting solutions.   COVID 19 screen:  No recent travel or known exposure to COVID19 The patient denies respiratory symptoms of COVID 19 at this time. The importance of social distancing was discussed today.   Assessment and Plan The patient's preventative maintenance and recommended screening tests for an annual wellness exam were reviewed in full today. Brought up  to date unless services declined.  Counselled on the importance of diet, exercise, and its role in overall health and mortality. The patient's FH and SH was reviewed, including their home life, tobacco status, and drug and alcohol status.   Vaccines: Uptodate Tdap,  flu at employee [facility] The PAP/DVE: Sees Dr. Wayna Hails, GYN, last pap 2022  Mammogram : per GYN, fall 2024 No early family history of breast cancer or colon cancer. Colon cancer screening:  11/2021 colonoscopy, recall 10 year, Dr. Angelica Bard. HIV: refused   Problem List Items Addressed This Visit   None Visit Diagnoses       Routine general medical examination at a health care facility    -  Primary     Vegetarianism         Breast cancer screening by mammogram       Relevant Orders   MM 3D SCREENING MAMMOGRAM BILATERAL BREAST        Herby Lolling, MD

## 2023-09-11 ENCOUNTER — Telehealth: Payer: Self-pay

## 2023-09-11 NOTE — Telephone Encounter (Signed)
 Copied from CRM [id number]. Topic: General - Other >> Sep 11, 2023  2:13 PM Abigail D wrote: Reason for CRM: Mrs. Connie Rosales calling in regards to mammogram, she said she had one back in the fall at [facility]. She stated that [facility] continues to call her but is wondering if it could be communicated to [facility] that she has already had her mammogram.

## 2023-09-11 NOTE — Telephone Encounter (Signed)
 Order was placed for Mammogram yesterday by [facility] per Dr. Millie Alm instructions. .  Looks like patient is not due for another mammogram until Oct 2025.  I think [facility] is calling her because it is now it their work queue.  Can order be cancelled.  Please advise.

## 2023-09-11 NOTE — Telephone Encounter (Signed)
 Mammogram report is actually in patient's chart from 03/05/2023 but for some reason is was still showing due on [facility].  Mammogram order that was placed yesterday cancelled.  Left message for Virginie that this has been taken care of.

## 2023-09-11 NOTE — Telephone Encounter (Signed)
 Yes, the order can be canceled.  That was miscommunication on my part.  I had intended to have the mammogram requested from her gynecologist as she had had it in the fall as it was not in the chart on my review. Please request the result of the mammogram in September 2024 for our records.

## 2023-09-11 NOTE — Addendum Note (Signed)
 Addended by: Wyn Heater on: 09/11/2023 04:41 PM   Modules accepted: Orders

## 2023-09-26 ENCOUNTER — Ambulatory Visit: Admitting: Family Medicine

## 2023-10-06 ENCOUNTER — Ambulatory Visit: Payer: BC Managed Care – PPO | Admitting: Dermatology

## 2023-10-08 ENCOUNTER — Encounter: Payer: Self-pay | Admitting: Family Medicine

## 2024-02-25 DIAGNOSIS — Z01419 Encounter for gynecological examination (general) (routine) without abnormal findings: Secondary | ICD-10-CM | POA: Diagnosis not present

## 2024-02-25 DIAGNOSIS — Z1231 Encounter for screening mammogram for malignant neoplasm of breast: Secondary | ICD-10-CM | POA: Diagnosis not present

## 2024-03-22 ENCOUNTER — Ambulatory Visit: Payer: BC Managed Care – PPO | Admitting: Dermatology

## 2024-03-22 DIAGNOSIS — I781 Nevus, non-neoplastic: Secondary | ICD-10-CM

## 2024-03-22 DIAGNOSIS — L821 Other seborrheic keratosis: Secondary | ICD-10-CM | POA: Diagnosis not present

## 2024-03-22 DIAGNOSIS — L814 Other melanin hyperpigmentation: Secondary | ICD-10-CM

## 2024-03-22 DIAGNOSIS — D1801 Hemangioma of skin and subcutaneous tissue: Secondary | ICD-10-CM

## 2024-03-22 DIAGNOSIS — Z1283 Encounter for screening for malignant neoplasm of skin: Secondary | ICD-10-CM

## 2024-03-22 DIAGNOSIS — L578 Other skin changes due to chronic exposure to nonionizing radiation: Secondary | ICD-10-CM

## 2024-03-22 DIAGNOSIS — D229 Melanocytic nevi, unspecified: Secondary | ICD-10-CM

## 2024-03-22 DIAGNOSIS — Q825 Congenital non-neoplastic nevus: Secondary | ICD-10-CM

## 2024-03-22 DIAGNOSIS — Z86018 Personal history of other benign neoplasm: Secondary | ICD-10-CM

## 2024-03-22 NOTE — Patient Instructions (Signed)

## 2024-03-22 NOTE — Progress Notes (Signed)
   Follow-Up Visit   Subjective  Connie Rosales is a 48 y.o. female who presents for the following: Skin Cancer Screening and Full Body Skin Exam. History of dysplastic nevi.   The patient presents for Total-Body Skin Exam (TBSE) for skin cancer screening and mole check. The patient has spots, moles and lesions to be evaluated, some may be new or changing.    The following portions of the chart were reviewed this encounter and updated as appropriate: medications, allergies, medical history  Review of Systems:  No other skin or systemic complaints except as noted in HPI or Assessment and Plan.  Objective  Well appearing patient in no apparent distress; mood and affect are within normal limits.  A full examination was performed including scalp, head, eyes, ears, nose, lips, neck, chest, axillae, abdomen, back, buttocks, bilateral upper extremities, bilateral lower extremities, hands, feet, fingers, toes, fingernails, and toenails. All findings within normal limits unless otherwise noted below.   Relevant physical exam findings are noted in the Assessment and Plan.    Assessment & Plan   SKIN CANCER SCREENING PERFORMED TODAY.  ACTINIC DAMAGE - Chronic condition, secondary to cumulative UV/sun exposure - diffuse scaly erythematous macules with underlying dyspigmentation - Recommend daily broad spectrum sunscreen SPF 30+ to sun-exposed areas, reapply every 2 hours as needed.  - Staying in the shade or wearing long sleeves, sun glasses (UVA+UVB protection) and wide brim hats (4-inch brim around the entire circumference of the hat) are also recommended for sun protection.  - Call for new or changing lesions.  LENTIGINES, SEBORRHEIC KERATOSES, HEMANGIOMAS - Benign normal skin lesions - Benign-appearing - Call for any changes  MELANOCYTIC NEVI - Tan-brown and/or pink-flesh-colored symmetric macules and papules - Left Anterior Thigh 2.5 mm slight irregular medium brown macule - Right  Sternum 4.0 mm medium light brown macule - Left Flexor Forearm 2.5 mm regular medium brown macule - Right medial scapula 4.0 mm med light brown speckled macule - Left lateral eye 2.5 mm light tan papule (Patient may schedule appt for shave removal) - Benign appearing on exam today - Observation - Call clinic for new or changing moles - Recommend daily use of broad spectrum spf 30+ sunscreen to sun-exposed areas.   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  Observation.  Call clinic for new or changing moles.  Recommend daily use of broad spectrum spf 30+ sunscreen to sun-exposed areas.    HISTORY OF DYSPLASTIC NEVUS Left anterior thigh mild and left post shoulder 2021 No evidence of recurrence today Recommend regular full body skin exams Recommend daily broad spectrum sunscreen SPF 30+ to sun-exposed areas, reapply every 2 hours as needed.  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 Pt will call back for shave removal for nevus at left lat eye.  IAndrea Kerns, CMA, am acting as scribe for Rexene Rattler, MD .   Documentation: I have reviewed the above documentation for accuracy and completeness, and I agree with the above.  Rexene Rattler, MD

## 2024-04-28 ENCOUNTER — Ambulatory Visit: Admitting: Dermatology

## 2024-04-28 DIAGNOSIS — D485 Neoplasm of uncertain behavior of skin: Secondary | ICD-10-CM | POA: Diagnosis not present

## 2024-04-28 DIAGNOSIS — D2239 Melanocytic nevi of other parts of face: Secondary | ICD-10-CM | POA: Diagnosis not present

## 2024-04-28 DIAGNOSIS — D229 Melanocytic nevi, unspecified: Secondary | ICD-10-CM

## 2024-04-28 DIAGNOSIS — L814 Other melanin hyperpigmentation: Secondary | ICD-10-CM | POA: Diagnosis not present

## 2024-04-28 DIAGNOSIS — D22122 Melanocytic nevi of left lower eyelid, including canthus: Secondary | ICD-10-CM | POA: Diagnosis not present

## 2024-04-28 NOTE — Patient Instructions (Signed)

## 2024-04-28 NOTE — Progress Notes (Signed)
° °  Follow-Up Visit   Subjective  Connie Rosales is a 48 y.o. female who presents for the following: mole removal at the left lateral eye, irritated.   The following portions of the chart were reviewed this encounter and updated as appropriate: medications, allergies, medical history  Review of Systems:  No other skin or systemic complaints except as noted in HPI or Assessment and Plan.  Objective  Well appearing patient in no apparent distress; mood and affect are within normal limits.  A focused examination was performed of the following areas: Face  Relevant physical exam findings are noted in the Assessment and Plan.  Left Lateral Eye 0.3 cm light tan papule    Assessment & Plan   NEOPLASM OF UNCERTAIN BEHAVIOR OF SKIN Left Lateral Eye Epidermal / dermal shaving  Lesion diameter (cm):  0.3 Informed consent: discussed and consent obtained   Patient was prepped and draped in usual sterile fashion: Area prepped with alcohol. Anesthesia: the lesion was anesthetized in a standard fashion   Anesthetic:  1% lidocaine  w/ epinephrine 1-100,000 buffered w/ 8.4% NaHCO3 Instrument used: flexible razor blade   Hemostasis achieved with: pressure, aluminum chloride and electrodesiccation   Outcome: patient tolerated procedure well   Post-procedure details: wound care instructions given   Post-procedure details comment:  Ointment and small bandage applied  Specimen 1 - Surgical pathology Differential Diagnosis: Irritated Nevus vs other Check Margins: No  LENTIGINES Exam: scattered tan macules face Due to sun exposure Treatment Plan: Benign-appearing, observe. Recommend daily broad spectrum sunscreen SPF 30+ to sun-exposed areas, reapply every 2 hours as needed.  Call for any changes   MELANOCYTIC NEVI Exam: Tan-brown and/or pink-flesh-colored symmetric macules and papules  Treatment Plan: Benign appearing on exam today. Recommend observation. Call clinic for new or changing  moles. Recommend daily use of broad spectrum spf 30+ sunscreen to sun-exposed areas.    Return as scheduled, for TBSE.  IAndrea Kerns, CMA, am acting as scribe for Rexene Rattler, MD .   Documentation: I have reviewed the above documentation for accuracy and completeness, and I agree with the above.  Rexene Rattler, MD

## 2024-04-30 LAB — SURGICAL PATHOLOGY

## 2024-05-03 ENCOUNTER — Ambulatory Visit: Payer: Self-pay | Admitting: Dermatology

## 2024-05-03 NOTE — Telephone Encounter (Signed)
-----   Message from Rexene Rattler, MD sent at 05/03/2024  9:46 AM EST ----- 1. Skin, left lateral eye :       MELANOCYTIC NEVUS, INTRADERMAL TYPE, BASE INVOLVED   Benign mole - please call patient

## 2024-05-03 NOTE — Telephone Encounter (Signed)
 Advised patient biopsy of the left lateral eye was a benign nevus. No further treatment needed.

## 2025-03-28 ENCOUNTER — Encounter: Admitting: Dermatology
